# Patient Record
Sex: Male | Born: 1990 | Race: White | Hispanic: No | Marital: Single | Smoking: Former smoker
Health system: Southern US, Community
[De-identification: ages and names within clinical notes are randomized; demographics above are authoritative.]

## PROBLEM LIST (undated history)

## (undated) ENCOUNTER — Ambulatory Visit: Admission: EM | Payer: PRIVATE HEALTH INSURANCE

---
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
## 2010-03-12 ENCOUNTER — Emergency Department: Payer: Self-pay | Admitting: Emergency Medicine

## 2010-03-14 ENCOUNTER — Inpatient Hospital Stay (HOSPITAL_COMMUNITY): Admission: EM | Admit: 2010-03-14 | Discharge: 2010-03-18 | Payer: Self-pay | Admitting: Emergency Medicine

## 2010-03-18 DIAGNOSIS — F450 Somatization disorder: Secondary | ICD-10-CM

## 2010-03-30 ENCOUNTER — Ambulatory Visit (HOSPITAL_COMMUNITY): Payer: Self-pay | Admitting: Psychiatry

## 2010-04-27 ENCOUNTER — Ambulatory Visit (HOSPITAL_COMMUNITY): Payer: Self-pay | Admitting: Psychiatry

## 2010-06-02 ENCOUNTER — Ambulatory Visit (HOSPITAL_COMMUNITY)
Admission: RE | Admit: 2010-06-02 | Discharge: 2010-06-02 | Payer: Self-pay | Source: Home / Self Care | Attending: Psychology | Admitting: Psychology

## 2010-07-28 LAB — CBC
HCT: 43 % (ref 39.0–52.0)
MCHC: 33.7 g/dL (ref 30.0–36.0)
MCV: 94.1 fL (ref 78.0–100.0)
Platelets: 244 10*3/uL (ref 150–400)
RDW: 12.2 % (ref 11.5–15.5)
WBC: 7.5 10*3/uL (ref 4.0–10.5)

## 2010-07-28 LAB — TSH: TSH: 2.258 u[IU]/mL (ref 0.350–4.500)

## 2010-07-29 LAB — RAPID URINE DRUG SCREEN, HOSP PERFORMED
Amphetamines: NOT DETECTED
Barbiturates: NOT DETECTED
Benzodiazepines: NOT DETECTED
Cocaine: NOT DETECTED
Opiates: NOT DETECTED
Tetrahydrocannabinol: POSITIVE — AB

## 2010-07-29 LAB — DIFFERENTIAL
Basophils Absolute: 0.1 10*3/uL (ref 0.0–0.1)
Basophils Relative: 1 % (ref 0–1)
Eosinophils Absolute: 1.1 10*3/uL — ABNORMAL HIGH (ref 0.0–0.7)
Eosinophils Relative: 11 % — ABNORMAL HIGH (ref 0–5)
Lymphocytes Relative: 34 % (ref 12–46)
Lymphs Abs: 3.4 K/uL (ref 0.7–4.0)
Monocytes Absolute: 0.7 10*3/uL (ref 0.1–1.0)
Monocytes Relative: 7 % (ref 3–12)
Neutro Abs: 4.7 K/uL (ref 1.7–7.7)
Neutrophils Relative %: 47 % (ref 43–77)

## 2010-07-29 LAB — BASIC METABOLIC PANEL WITH GFR
Calcium: 9.5 mg/dL (ref 8.4–10.5)
GFR calc Af Amer: >60 mL/min (ref >60)
GFR calc non Af Amer: >60 mL/min (ref >60)
Sodium: 139 meq/L (ref 135–145)

## 2010-07-29 LAB — COMPREHENSIVE METABOLIC PANEL
ALT: 14 U/L (ref 0–53)
Albumin: 4.2 g/dL (ref 3.5–5.2)
Alkaline Phosphatase: 39 U/L (ref 39–117)
BUN: 14 mg/dL (ref 6–23)
CO2: 30 mEq/L (ref 19–32)
Calcium: 9.7 mg/dL (ref 8.4–10.5)
Chloride: 106 mEq/L (ref 96–112)
GFR calc non Af Amer: >60 mL/min (ref >60)
Glucose, Bld: 76 mg/dL (ref 70–99)
Glucose, Bld: 85 mg/dL (ref 70–99)
Potassium: 4.3 mEq/L (ref 3.5–5.1)
Sodium: 138 mEq/L (ref 135–145)
Sodium: 140 mEq/L (ref 135–145)
Total Bilirubin: 0.4 mg/dL (ref 0.3–1.2)
Total Protein: 6.9 g/dL (ref 6.0–8.3)

## 2010-07-29 LAB — URINALYSIS, ROUTINE W REFLEX MICROSCOPIC
Bilirubin Urine: NEGATIVE
Glucose, UA: NEGATIVE mg/dL
Hgb urine dipstick: NEGATIVE
Ketones, ur: NEGATIVE mg/dL
Nitrite: NEGATIVE
Protein, ur: NEGATIVE mg/dL
Specific Gravity, Urine: 1.018 (ref 1.005–1.030)
Urobilinogen, UA: 0.2 mg/dL (ref 0.0–1.0)
pH: 7 (ref 5.0–8.0)

## 2010-07-29 LAB — BASIC METABOLIC PANEL
BUN: 13 mg/dL (ref 6–23)
CO2: 22 mEq/L (ref 19–32)
Chloride: 109 mEq/L (ref 96–112)
Creatinine, Ser: 1.13 mg/dL (ref 0.4–1.5)
Glucose, Bld: 93 mg/dL (ref 70–99)
Potassium: 3.9 mEq/L (ref 3.5–5.1)

## 2010-07-29 LAB — TSH: TSH: 1.565 u[IU]/mL (ref 0.350–4.500)

## 2010-07-29 LAB — CBC
HCT: 43.4 % (ref 39.0–52.0)
HCT: 43.8 % (ref 39.0–52.0)
Hemoglobin: 15.3 g/dL (ref 13.0–17.0)
MCH: 32.2 pg (ref 26.0–34.0)
MCHC: 35.3 g/dL (ref 30.0–36.0)
MCV: 91.4 fL (ref 78.0–100.0)
MCV: 95 fL (ref 78.0–100.0)
Platelets: 289 10*3/uL (ref 150–400)
RBC: 4.61 MIL/uL (ref 4.22–5.81)
RBC: 4.75 MIL/uL (ref 4.22–5.81)
RDW: 12.1 % (ref 11.5–15.5)
WBC: 10 K/uL (ref 4.0–10.5)
WBC: 7.8 10*3/uL (ref 4.0–10.5)

## 2010-07-29 LAB — MRSA PCR SCREENING: MRSA by PCR: NEGATIVE

## 2010-07-29 LAB — ETHANOL: Alcohol, Ethyl (B): <5 mg/dL (ref 0–10)

## 2010-09-15 ENCOUNTER — Encounter (HOSPITAL_COMMUNITY): Payer: Self-pay | Admitting: Psychiatry

## 2010-10-15 ENCOUNTER — Encounter (HOSPITAL_COMMUNITY): Payer: 59 | Admitting: Psychiatry

## 2010-10-15 DIAGNOSIS — F909 Attention-deficit hyperactivity disorder, unspecified type: Secondary | ICD-10-CM

## 2010-11-12 ENCOUNTER — Encounter (HOSPITAL_COMMUNITY): Payer: 59 | Admitting: Psychiatry

## 2010-11-12 DIAGNOSIS — F909 Attention-deficit hyperactivity disorder, unspecified type: Secondary | ICD-10-CM

## 2010-12-15 ENCOUNTER — Encounter (HOSPITAL_COMMUNITY): Payer: 59 | Admitting: Psychiatry

## 2010-12-17 ENCOUNTER — Encounter (HOSPITAL_COMMUNITY): Payer: 59 | Admitting: Psychiatry

## 2012-08-23 ENCOUNTER — Ambulatory Visit: Payer: Self-pay | Admitting: Family Medicine

## 2012-08-23 ENCOUNTER — Emergency Department: Payer: Self-pay | Admitting: Emergency Medicine

## 2012-08-23 LAB — LIPASE, BLOOD: Lipase: 103 U/L (ref 73–393)

## 2012-08-23 LAB — COMPREHENSIVE METABOLIC PANEL
Albumin: 3.8 g/dL (ref 3.4–5.0)
Bilirubin,Total: 0.4 mg/dL (ref 0.2–1.0)
Chloride: 104 mmol/L (ref 98–107)
Co2: 27 mmol/L (ref 21–32)
Creatinine: 0.96 mg/dL (ref 0.60–1.30)
EGFR (African American): >60
Potassium: 4.7 mmol/L (ref 3.5–5.1)
SGOT(AST): 20 U/L (ref 15–37)
Sodium: 142 mmol/L (ref 136–145)
Total Protein: 7.3 g/dL (ref 6.4–8.2)

## 2012-08-23 LAB — CBC WITH DIFFERENTIAL/PLATELET
HCT: 45.1 % (ref 40.0–52.0)
Lymphocyte %: 24 %
MCHC: 33.6 g/dL (ref 32.0–36.0)
MCV: 92 fL (ref 80–100)
Monocyte %: 9.2 %
Neutrophil #: 4.6 10*3/uL (ref 1.4–6.5)
Neutrophil %: 58.3 %
Platelet: 294 10*3/uL (ref 150–440)

## 2012-08-23 LAB — URINALYSIS, COMPLETE
Blood: NEGATIVE
Glucose,UR: NEGATIVE mg/dL (ref 0–75)
Leukocyte Esterase: NEGATIVE
Nitrite: NEGATIVE
Ph: 7 (ref 4.5–8.0)

## 2012-08-23 LAB — MONONUCLEOSIS SCREEN: Mono Test: POSITIVE

## 2012-08-23 LAB — AMYLASE: Amylase: 43 U/L (ref 25–115)

## 2013-07-22 LAB — CBC
HCT: 41.4 % (ref 40.0–52.0)
HGB: 13.8 g/dL (ref 13.0–18.0)
MCH: 30.6 pg (ref 26.0–34.0)
MCHC: 33.4 g/dL (ref 32.0–36.0)
MCV: 92 fL (ref 80–100)
PLATELETS: 269 10*3/uL (ref 150–440)
RBC: 4.51 10*6/uL (ref 4.40–5.90)
RDW: 13.3 % (ref 11.5–14.5)
WBC: 10.2 10*3/uL (ref 3.8–10.6)

## 2013-07-22 LAB — COMPREHENSIVE METABOLIC PANEL
ALBUMIN: 3.7 g/dL (ref 3.4–5.0)
Alkaline Phosphatase: 55 U/L
Anion Gap: 7 (ref 7–16)
BUN: 18 mg/dL (ref 7–18)
Bilirubin,Total: 0.2 mg/dL (ref 0.2–1.0)
CO2: 24 mmol/L (ref 21–32)
Calcium, Total: 8.8 mg/dL (ref 8.5–10.1)
Chloride: 111 mmol/L — ABNORMAL HIGH (ref 98–107)
Creatinine: 0.99 mg/dL (ref 0.60–1.30)
GLUCOSE: 96 mg/dL (ref 65–99)
OSMOLALITY: 285 (ref 275–301)
POTASSIUM: 3.8 mmol/L (ref 3.5–5.1)
SGOT(AST): 13 U/L — ABNORMAL LOW (ref 15–37)
SGPT (ALT): 20 U/L (ref 12–78)
Sodium: 142 mmol/L (ref 136–145)
TOTAL PROTEIN: 7.2 g/dL (ref 6.4–8.2)

## 2013-07-22 LAB — ETHANOL

## 2013-07-22 LAB — ACETAMINOPHEN LEVEL: Acetaminophen: <2 ug/mL

## 2013-07-22 LAB — SALICYLATE LEVEL: Salicylates, Serum: <1.7 mg/dL

## 2013-07-23 ENCOUNTER — Inpatient Hospital Stay: Payer: Self-pay | Admitting: Psychiatry

## 2013-07-23 LAB — DRUG SCREEN, URINE
Amphetamines, Ur Screen: NEGATIVE (ref ?–1000)
BARBITURATES, UR SCREEN: NEGATIVE (ref ?–200)
BENZODIAZEPINE, UR SCRN: NEGATIVE (ref ?–200)
Cannabinoid 50 Ng, Ur ~~LOC~~: POSITIVE (ref ?–50)
Cocaine Metabolite,Ur ~~LOC~~: NEGATIVE (ref ?–300)
MDMA (ECSTASY) UR SCREEN: NEGATIVE (ref ?–500)
METHADONE, UR SCREEN: NEGATIVE (ref ?–300)
Opiate, Ur Screen: NEGATIVE (ref ?–300)
PHENCYCLIDINE (PCP) UR S: NEGATIVE (ref ?–25)
Tricyclic, Ur Screen: NEGATIVE (ref ?–1000)

## 2013-07-23 LAB — URINALYSIS, COMPLETE
Bacteria: NONE SEEN
Bilirubin,UR: NEGATIVE
Blood: NEGATIVE
Glucose,UR: NEGATIVE mg/dL (ref 0–75)
KETONE: NEGATIVE
LEUKOCYTE ESTERASE: NEGATIVE
Nitrite: NEGATIVE
PROTEIN: NEGATIVE
Ph: 5 (ref 4.5–8.0)
Specific Gravity: 1.031 (ref 1.003–1.030)
Squamous Epithelial: NONE SEEN
WBC UR: 1 /HPF (ref 0–5)

## 2013-07-23 LAB — TSH: Thyroid Stimulating Horm: 0.722 u[IU]/mL

## 2014-09-07 NOTE — H&P (Signed)
PATIENT NAME:  LEUL, NARRAMORE MR#:  [id number] DATE OF BIRTH:  03/31/1991  DATE OF ADMISSION:  07/23/2013  REFERRING PHYSICIAN: Emergency Room MD   ATTENDING PHYSICIAN: Rosabell Geyer B. Jennet Maduro, MD   IDENTIFYING DATA: Mr. Chagnon is a 24 year old male with history of ADHD.  CHIEF COMPLAINT: "I tried to hang myself."   HISTORY OF PRESENT ILLNESS: Mr. Tursi was brought to the hospital by his family after he attempted suicide by initially superficially scratching his wrist and then attempting to hang himself in the closet with a belt. He has been arguing with his wife more so than ever. They have a difficult relationship and a 77-month-old baby. Even though the wife and the patient both work there is not enough money coming in every month. The patient's mother has been helping them, but apparently his wife does not like her mother-in-law to intervene. The patient reports that as a child he was treated with multiple medications for ADHD but he stopped taking them at the age of 41. He is now 22. He has been under increased stress test lately due to conflict with his wife and the hardships of taking care of the baby. He reports poor sleep, decreased appetite with 6 or 7 pound weight loss, anhedonia, feeling of guilt, hopelessness, worthlessness, poor memory and concentration, poor energy level, and crying spells, but also irritability, inability to keep his cool, and argumentativeness. He denies having suicide ideation for awhile, but he was so exasperated arguing with his wife that felt that this is the only way to go. Today he is somewhat happy that things did not work out, but is uncertain if he could be safe at home now. He is agreeable to a family meeting. He denies symptoms suggestive of bipolar mania. He denies psychotic symptoms. He is not a drinker, but does use marijuana on a regular basis.   PAST PSYCHIATRIC HISTORY: As above ADHD as a child. There were no other suicide attempts. No  hospitalizations. He is ready to start medications now.  FAMILY PSYCHIATRIC HISTORY: Father with mental illness.   PAST MEDICAL HISTORY: None.   ALLERGIES: No known drug allergies.   MEDICATIONS ON ADMISSION: None.   SOCIAL HISTORY: He is married. He lives with his wife and his brother and a 54-month-old baby. They all work. The patient actually works 2 jobs to support his family. He feels that his wife spends too much money and is wasteful.   REVIEW OF SYSTEMS: CONSTITUTIONAL: No fevers or chills. No weight changes.  EYES: No double or blurred vision.  ENT: No hearing loss.  RESPIRATORY: No shortness of breath or cough.  CARDIOVASCULAR: No chest pain or orthopnea.  GASTROINTESTINAL: No abdominal pain, nausea, vomiting, or diarrhea.  GENITOURINARY: No incontinence or frequency.  ENDOCRINE: No heat or cold intolerance.  LYMPHATIC: No anemia or easy bruising.  INTEGUMENTARY: No acne or rash.  MUSCULOSKELETAL: No muscle or joint pain.  NEUROLOGIC: No tingling or weakness.  PSYCHIATRIC: See history of present illness for details.   PHYSICAL EXAMINATION: VITAL SIGNS: Blood pressure 130/84, pulse 78, respirations 20, temperature 97.9.  GENERAL: This is a tall, slightly obese male in no acute distress.  HEENT: The pupils are equal, round, and reactive to light. Sclerae are anicteric.  NECK: Supple. No thyromegaly.  LUNGS: Clear to auscultation. No dullness to percussion.  HEART: Regular rhythm and rate. No murmurs, rubs, or gallops.  ABDOMEN: Soft, nontender, nondistended. Positive bowel sounds.  MUSCULOSKELETAL: Normal muscle strength in all extremities.  SKIN:  No rashes or bruises.  LYMPHATIC: No cervical adenopathy.  NEUROLOGIC: Cranial nerves II through XII are intact.   LABORATORY DATA: Chemistries are within normal limits. Blood alcohol level is zero. LFTs within normal limits. TSH 0.722. Urine tox screen is positive for cannabis. CBC within normal limits. Urinalysis is not  suggestive of urinary tract infection. Serum acetaminophen and salicylates are low.   MENTAL STATUS EXAMINATION ON ADMISSION: The patient is alert and oriented to person, place, time, and situation. He is pleasant, polite, and cooperative. There is some psychomotor retardation. He maintains some eye contact. Speech is slow. Mood is depressed with flat affect. Thought process is logical and goal oriented. Thought content: He denies suicidal or homicidal ideation in the hospital, but fears that if discharged home he would go ahead and try to kill himself again. There are no delusions or paranoia. There are no auditory or visual hallucinations. His cognition is grossly intact. He registers three out of three and recalls three out of three objects after 5 minutes. He can spell "world" forwards and backwards. He knows the current president. He is of normal intelligence and appropriate fund of knowledge. His insight and judgment are limited.   SUICIDE RISK ASSESSMENT ON ADMISSION: This is a patient with history of attention deficit/hyperactivity disorder who developed depressive symptoms and suicidal ideation in the context of marital problems. He attempted suicide by cutting and hanging. He is at increased risk of suicide.   DIAGNOSES: AXIS I:  1.  Major depressive episode, severe. 2.  Cannibis abuse. 3.  Attention deficit/hyperactivity disorder by history.  AXIS II: Deferred.  AXIS III: Deferred.  AXIS IV: Mental illness, marital conflict, financial.  AXIS V: Global assessment of functioning 25.   PLAN: The patient was admitted to [facility] unit for safety, stabilization and medication management. He was initially placed on suicide precautions and was closely monitored for any unsafe behavior. He underwent full psychiatric and risk assessment. He received pharmacotherapy, individual and group psychotherapy, substance abuse counseling, and support from  therapeutic milieu.  1.  Suicidal ideation: The patient is able to contract for safety.  2.  Mood: The patient was started on Prozac for depression and Tegretol for mood stabilization.  3.  Substance abuse: The patient minimizes his problems and declines residential treatment.  4.  Disposition: He will return to home.  ____________________________ Ellin GoodieJolanta B. Jennet MaduroPucilowska, MD jbp:sb D: 07/23/2013 17:00:49 ET T: 07/23/2013 17:19:47 ET JOB#: [id number]  cc: Shamell Suarez B. Jennet MaduroPucilowska, MD, <Dictator> Shari ProwsJOLANTA B Tyke Outman MD ELECTRONICALLY SIGNED 07/28/2013 15:17

## 2014-09-10 IMAGING — CR CERVICAL SPINE - COMPLETE 4+ VIEW
1 series · 6 of 6 positions shown · non-contrast
Comparison: None available for comparison at time of study
interpretation.

CLINICAL DATA: Hanging injury.

EXAM:
CERVICAL SPINE  4+ VIEWS

[Series 1: w cervical spine obl · 0.14mm/px · 6 of 6 slices shown]
[im 1/6]
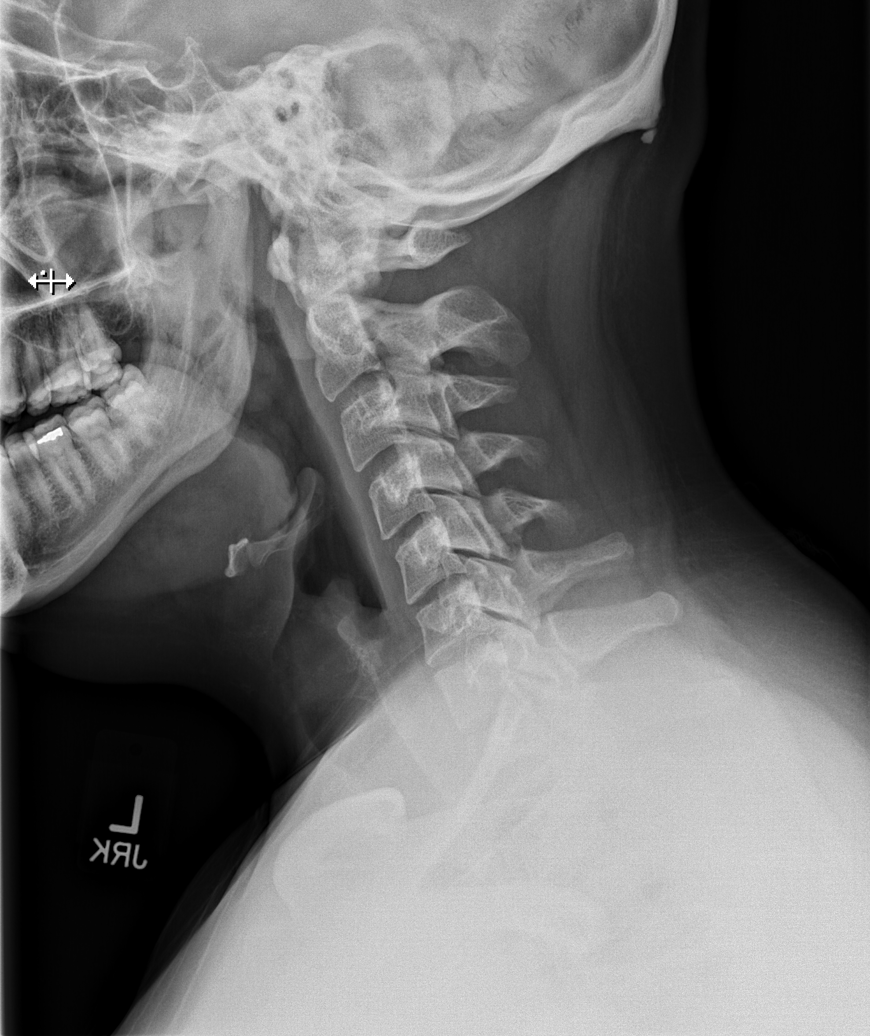
[im 2/6]
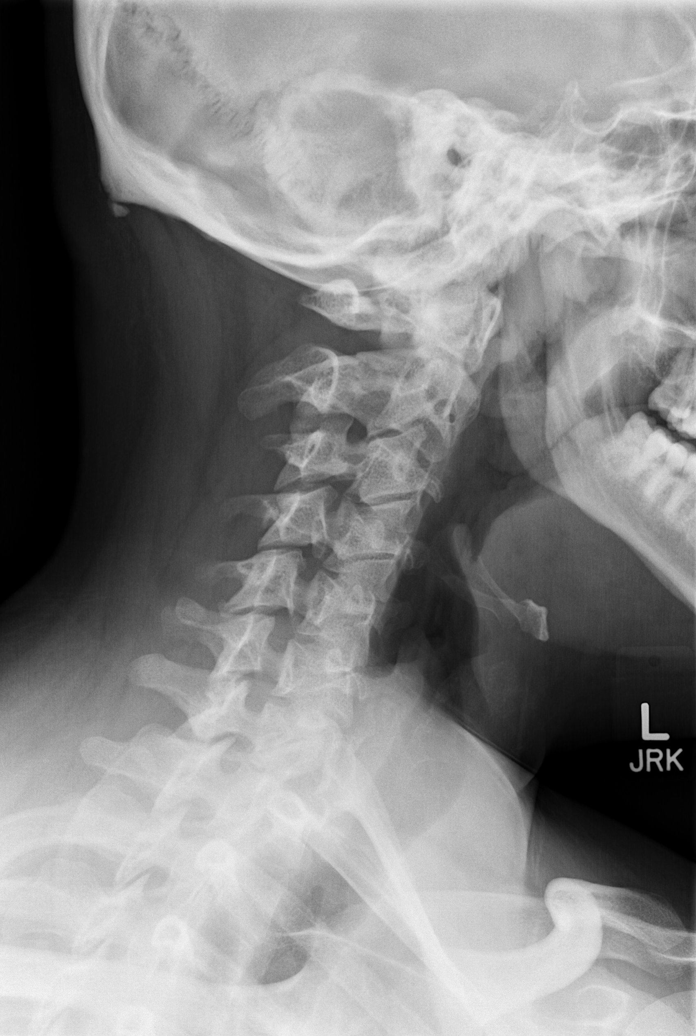
[im 3/6]
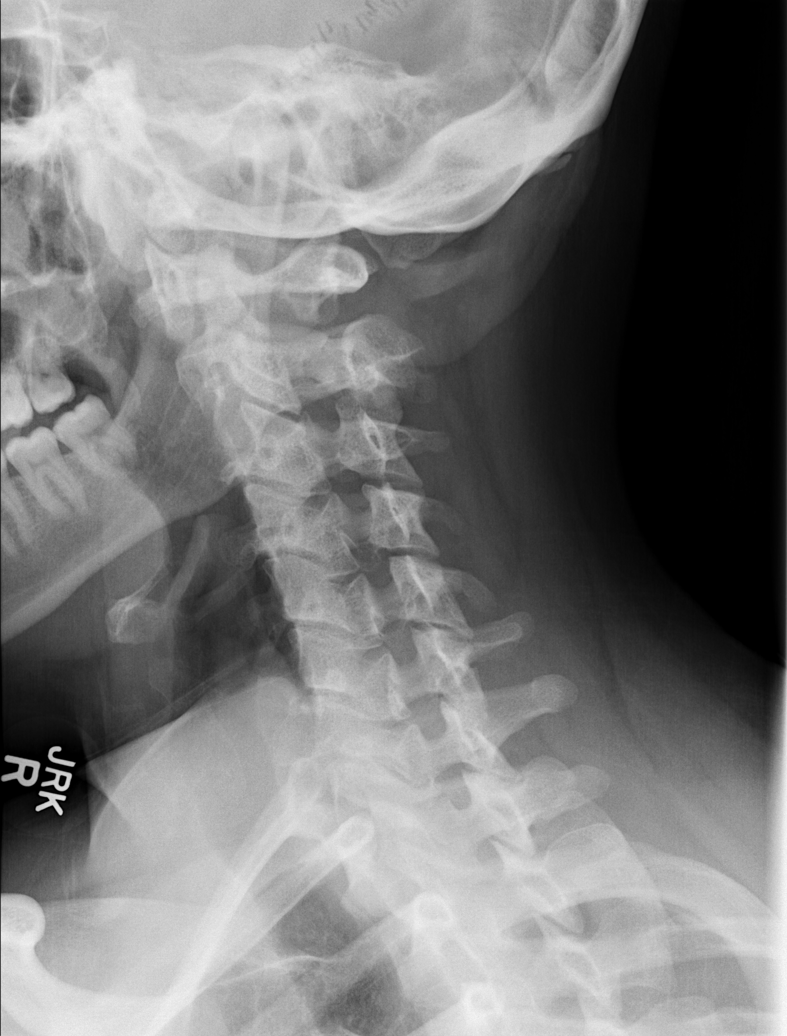
[im 4/6]
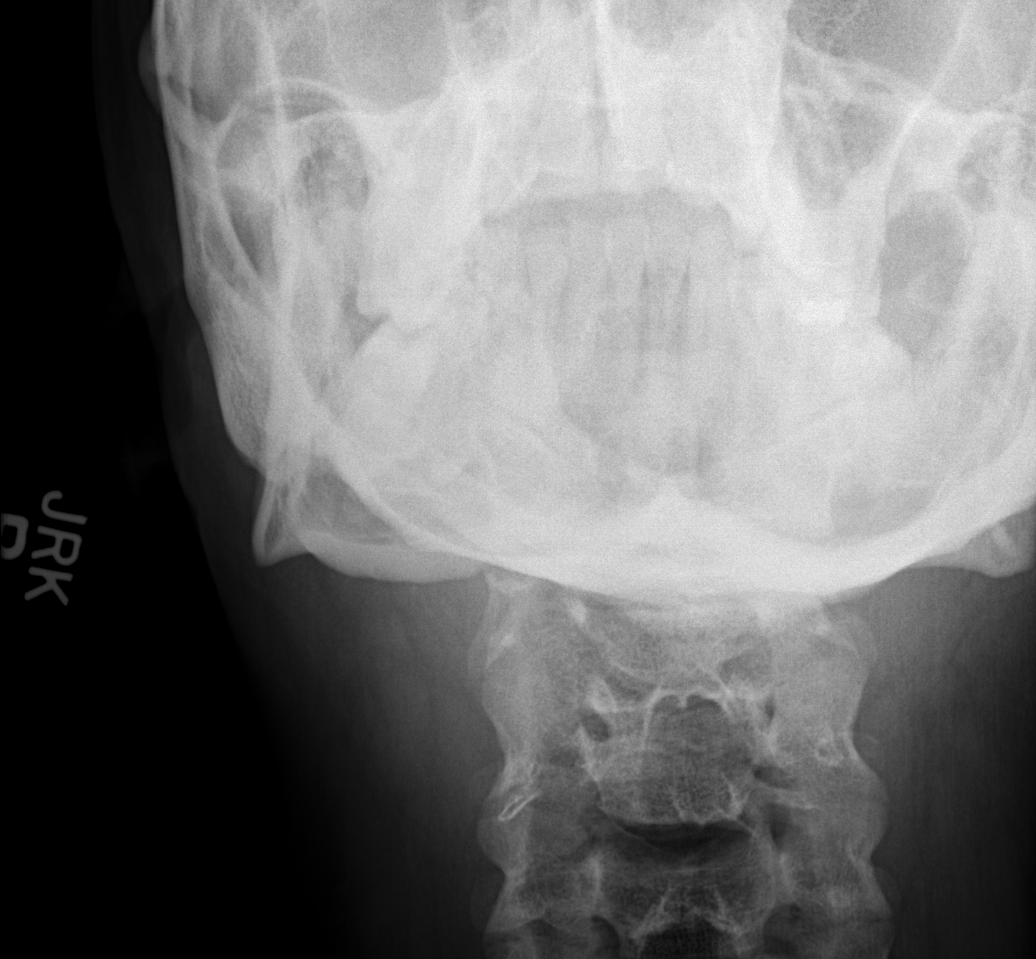
[im 5/6]
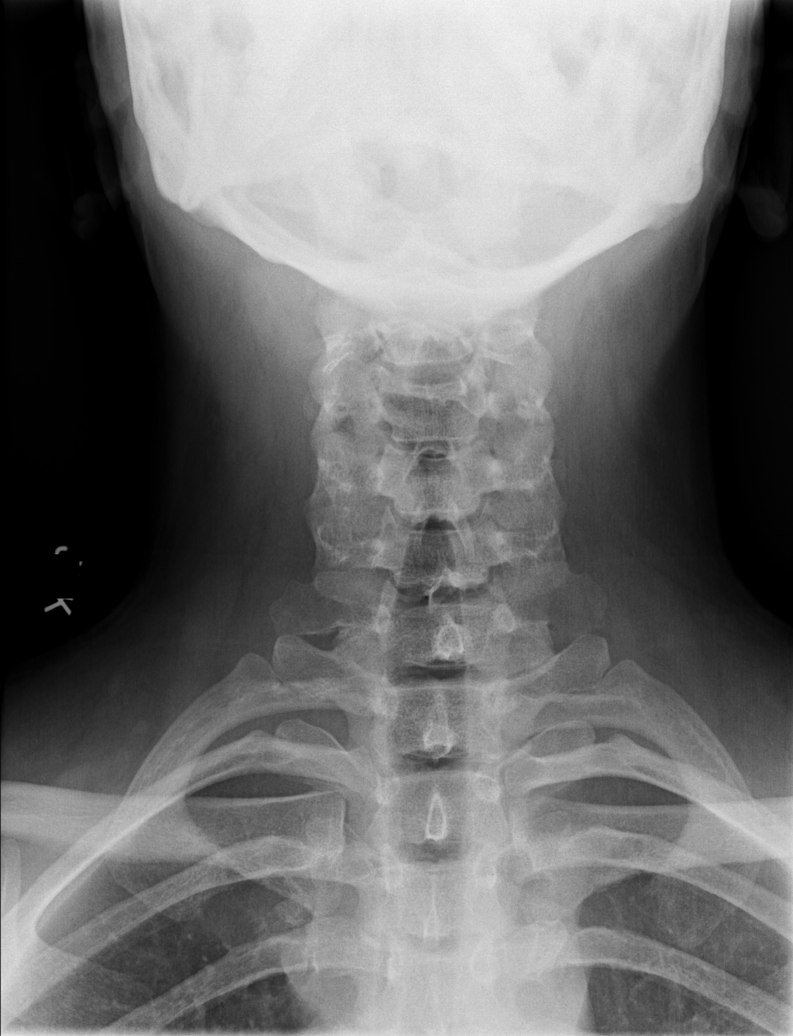
[im 6/6]
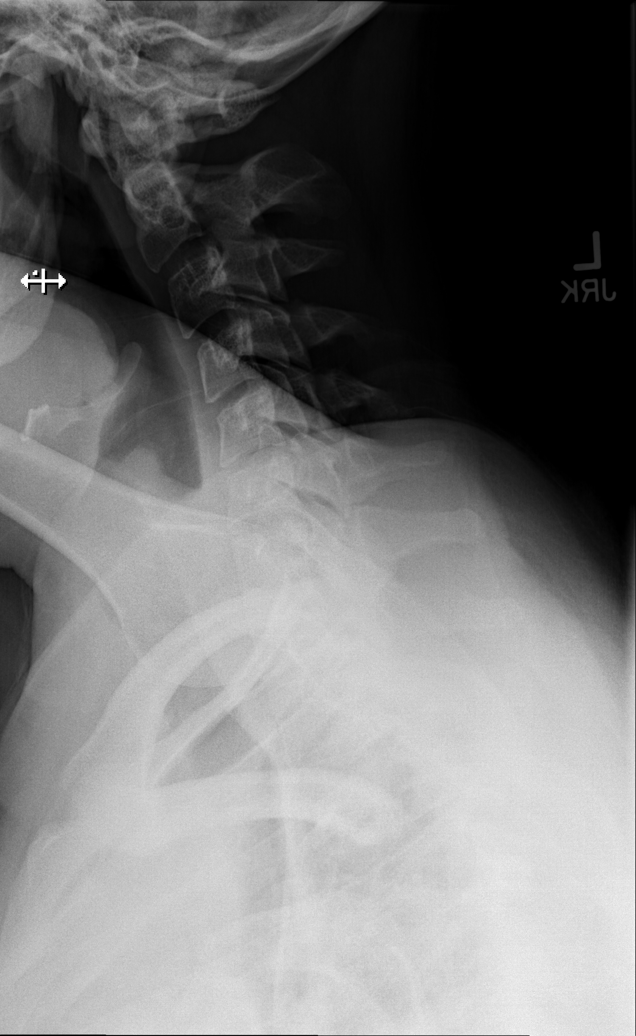

[6 of 6 positions shown; findings below may reference images not displayed]

FINDINGS: Cervical vertebral bodies and posterior elements appear intact and
aligned to the inferior endplate of C7, the most caudal well
visualized level. Straightened cervical lordosis. Intervertebral
disc heights preserved. No destructive bony lesions. Lateral masses
grossly and alignment though, limited odontoid view. Prevertebral
and paraspinal soft tissue planes are nonsuspicious.
IMPRESSION: Straightened cervical lordosis without acute fracture deformity or
malalignment.

  By: Carissa Salameh

## 2015-12-08 NOTE — Progress Notes (Signed)
This encounter was created in error - please disregard.

## 2016-07-20 DIAGNOSIS — Z720 Tobacco use: Secondary | ICD-10-CM | POA: Insufficient documentation

## 2016-07-20 DIAGNOSIS — Z6836 Body mass index (BMI) 36.0-36.9, adult: Secondary | ICD-10-CM | POA: Insufficient documentation

## 2016-07-20 DIAGNOSIS — K219 Gastro-esophageal reflux disease without esophagitis: Secondary | ICD-10-CM | POA: Insufficient documentation

## 2016-12-24 ENCOUNTER — Ambulatory Visit
Admission: EM | Admit: 2016-12-24 | Discharge: 2016-12-24 | Disposition: A | Discharge status: Home / Self Care | Payer: BLUE CROSS/BLUE SHIELD | Attending: Family | Admitting: Family

## 2016-12-24 ENCOUNTER — Encounter: Payer: Self-pay | Admitting: *Deleted

## 2016-12-24 DIAGNOSIS — K529 Noninfective gastroenteritis and colitis, unspecified: Secondary | ICD-10-CM | POA: Diagnosis not present

## 2016-12-24 DIAGNOSIS — R112 Nausea with vomiting, unspecified: Secondary | ICD-10-CM

## 2016-12-24 MED ORDER — ONDANSETRON HCL 4 MG PO TABS: 4.0000 mg | ORAL_TABLET | Freq: Three times a day (TID) | ORAL | 0 refills | Status: DC | PRN

## 2016-12-24 MED ORDER — ONDANSETRON 8 MG PO TBDP
8.0000 mg | ORAL_TABLET | Freq: Once | ORAL | Status: AC
  Administered 2016-12-24: 8 mg via ORAL

## 2016-12-24 NOTE — ED Provider Notes (Signed)
[facility]    CSN: [id number] Arrival date & time: 12/24/16  1436     History   Chief Complaint Chief Complaint  Patient presents with  . Nausea  . Emesis  . Generalized Body Aches  . Headache    HPI Brandon Molina is a 26 y.o. male.   Chief complaint nausea, vomiting started early this morning, unchanged.   3-4 episodes emesis, undigested food. No blood.  Ate last yesterday chinese food which isn't abnormal for him, and then tried chicken noodle soup and threw it up. No suspicious foods. Drinking gatorade.   No diarrhea, epigastric burning, CP, SOB, abdoiminal pain, bloating, constipation, dysuria, dizziness.   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   GERD- takes zantac PRN.       PCP [facility]. History of smoking, elevated blood pressure, GERD History reviewed. No pertinent past medical history.  There are no active problems to display for this patient.   History reviewed. No pertinent surgical history.     Home Medications    Prior to Admission medications   Medication Sig Start Date End Date Taking? Authorizing Provider  cetirizine (ZYRTEC) 10 MG tablet Take 10 mg by mouth daily.   Yes [provider]  ondansetron (ZOFRAN) 4 MG tablet Take 1 tablet (4 mg total) by mouth every 8 (eight) hours as needed for nausea or vomiting. 12/24/16   Allegra GranaArnett, Wayne Brunker G, FNP    Family History History reviewed. No pertinent family history.  Social History Social History  Substance Use Topics  . Smoking status: Current Every Day Smoker  . Smokeless tobacco: Never Used  . Alcohol use Yes     Allergies   Patient has no known allergies.   Review of Systems Review of Systems  Constitutional: Negative for chills and fever.  Respiratory: Negative for cough.   Cardiovascular: Negative for chest pain  and palpitations.  Gastrointestinal: Positive for nausea and vomiting. Negative for abdominal distention, abdominal pain, anal bleeding, blood in stool, constipation and diarrhea.  Genitourinary: Negative for dysuria.  Neurological: Negative for dizziness and headaches.     Physical Exam Triage Vital Signs ED Triage Vitals  Enc Vitals Group     BP 12/24/16 1526 134/88     Pulse Rate 12/24/16 1526 68     Resp 12/24/16 1526 16     Temp 12/24/16 1526 98.1 F (36.7 C)     Temp Source 12/24/16 1526 Oral     SpO2 12/24/16 1526 99 %     Weight 12/24/16 1529 280 lb (127 kg)     Height 12/24/16 1529 6\' 4"  (1.93 m)     Head Circumference --      Peak Flow --      Pain Score --      Pain Loc --      Pain Edu? --      Excl. in GC? --    No data found.   Updated Vital Signs BP 134/88 (BP Location: Left Arm)   Pulse 68   Temp 98.1 F (36.7 C) (Oral)   Resp 16   Ht 6\' 4"  (1.93 m)   Wt 280 lb (127 kg)   SpO2 99%   BMI 34.08 kg/m   Visual Acuity Right Eye Distance:   Left Eye Distance:   Bilateral Distance:    Right  Eye Near:   Left Eye Near:    Bilateral Near:     Physical Exam  Constitutional: He appears well-developed and well-nourished.  Cardiovascular: Regular rhythm and normal heart sounds.   Pulmonary/Chest: Effort normal and breath sounds normal. No respiratory distress. He has no wheezes. He has no rhonchi. He has no rales.  Abdominal: Normal appearance and bowel sounds are normal. He exhibits no pulsatile midline mass and no mass. There is no tenderness. There is no rigidity, no guarding, no CVA tenderness, no tenderness at McBurney's point and negative Murphy's sign.  No suprapubic tenderness.   Neurological: He is alert.  Skin: Skin is warm and dry.  Psychiatric: He has a normal mood and affect. His speech is normal and behavior is normal.  Vitals reviewed.    UC Treatments / Results  Labs (all labs ordered are listed, but only abnormal results are  displayed) Labs Reviewed - No data to display  EKG  EKG Interpretation None       Radiology No results found.  Procedures Procedures (including critical care time)  Medications Ordered in UC Medications  ondansetron (ZOFRAN-ODT) disintegrating tablet 8 mg (8 mg Oral Given 12/24/16 1527)     Initial Impression / Assessment and Plan / UC Course  I have reviewed the triage vital signs and the nursing notes.  Pertinent labs & imaging results that were available during my care of the patient were reviewed by me and considered in my medical decision making (see chart for details).       Final Clinical Impressions(s) / UC Diagnoses   Final diagnoses:  Gastroenteritis  Patient is well-appearing. He is afebrile. Some improvement with nausea, vomiting after taking Zofran while in her clinic. Last episode of emesis was this morning, and it was nonbloody. Reassured by normal abdominal exam, the absence of any tenderness, or rebound. Discussed with patient and mother, most likely viral gastroenteritis. Discussed conservative treatment of this. A few tabs of stat Zofran for symptom relief. Return precautions given.   New Prescriptions New Prescriptions   ONDANSETRON (ZOFRAN) 4 MG TABLET    Take 1 tablet (4 mg total) by mouth every 8 (eight) hours as needed for nausea or vomiting.     Controlled Substance Prescriptions [place] Controlled Substance Registry consulted? Not Applicable   Allegra Grana, FNP 12/24/16 [phone]

## 2016-12-24 NOTE — Discharge Instructions (Signed)
Suspect bowel gastroenteritis as discussed Please stay vigilant for any worsening symptoms including blood in your vomit or diarrhea, abdominal pain, fever Ensure plenty of fluids, advanced diet as tolerated starting with clear liquids Work note provided

## 2016-12-24 NOTE — ED Triage Notes (Signed)
N/V, body aches, headaches, fatigue, possible fever, since last night.

## 2018-12-19 ENCOUNTER — Ambulatory Visit (LOCAL_COMMUNITY_HEALTH_CENTER): Payer: Self-pay

## 2018-12-19 ENCOUNTER — Other Ambulatory Visit: Payer: Self-pay

## 2018-12-19 DIAGNOSIS — Z719 Counseling, unspecified: Secondary | ICD-10-CM

## 2018-12-19 NOTE — Progress Notes (Signed)
Pt to clinic requesting Hep A vaccine. Pt counseled that he has already completed his 2 dose series. [facility] copy provided to pt.

## 2018-12-20 ENCOUNTER — Other Ambulatory Visit: Payer: Self-pay

## 2018-12-20 ENCOUNTER — Encounter (HOSPITAL_COMMUNITY): Payer: Self-pay

## 2018-12-20 ENCOUNTER — Ambulatory Visit (HOSPITAL_COMMUNITY)
Admission: EM | Admit: 2018-12-20 | Discharge: 2018-12-20 | Disposition: A | Discharge status: Home / Self Care | Payer: PRIVATE HEALTH INSURANCE | Attending: Emergency Medicine | Admitting: Emergency Medicine

## 2018-12-20 DIAGNOSIS — R112 Nausea with vomiting, unspecified: Secondary | ICD-10-CM | POA: Diagnosis not present

## 2018-12-20 DIAGNOSIS — R030 Elevated blood-pressure reading, without diagnosis of hypertension: Secondary | ICD-10-CM | POA: Diagnosis not present

## 2018-12-20 DIAGNOSIS — R197 Diarrhea, unspecified: Secondary | ICD-10-CM

## 2018-12-20 MED ORDER — ONDANSETRON HCL 4 MG PO TABS: 4.0000 mg | ORAL_TABLET | Freq: Three times a day (TID) | ORAL | 0 refills | Status: DC | PRN

## 2018-12-20 MED ORDER — OMEPRAZOLE 20 MG PO CPDR: 20.0000 mg | DELAYED_RELEASE_CAPSULE | Freq: Every day | ORAL | 0 refills | Status: DC

## 2018-12-20 NOTE — ED Triage Notes (Signed)
Pt states he has diarrhea and nauseous x 3 days. Pt states his acid reflux has been flaring up.

## 2018-12-20 NOTE — ED Provider Notes (Signed)
[facility]    CSN: [id number] Arrival date & time: 12/20/18  [id number]     History   Chief Complaint Chief Complaint  Patient presents with  . Diarrhea  . Nausea    HPI Brandon Molina is a 28 y.o. male.   Patient presents with nausea, vomiting, diarrhea, and food regurgitation x3 days.  He states his symptoms began after eating a store-bought stomboli.  No emesis today; one episode of diarrhea.  He has taken Pepto-Bismol and Gaviscon with minimal relief.  He reports he is able to orally hydrate at home and has not vomited when he drinks fluids.  He denies fevers, chills, nasal congestion, sore throat, cough, shortness of breath, abdominal pain, dysuria, flank pain, or other symptoms.        The history is provided by the patient.    History reviewed. No pertinent past medical history.  There are no active problems to display for this patient.   History reviewed. No pertinent surgical history.     Home Medications    Prior to Admission medications   Medication Sig Start Date End Date Taking? Authorizing Provider  cetirizine (ZYRTEC) 10 MG tablet Take 10 mg by mouth daily.    [provider]  omeprazole (PRILOSEC) 20 MG capsule Take 1 capsule (20 mg total) by mouth daily. 12/20/18   Mickie Bailate, Raesean Bartoletti H, NP  ondansetron (ZOFRAN) 4 MG tablet Take 1 tablet (4 mg total) by mouth every 8 (eight) hours as needed for nausea or vomiting. 12/20/18   Mickie Bailate, Dominic Rhome H, NP    Family History History reviewed. No pertinent family history.  Social History Social History   Tobacco Use  . Smoking status: Current Every Day Smoker  . Smokeless tobacco: Never Used  Substance Use Topics  . Alcohol use: Yes  . Drug use: No     Allergies   Patient has no known allergies.   Review of Systems Review of Systems  Constitutional: Negative for chills and fever.  HENT: Negative for congestion, ear pain, rhinorrhea and sore throat.   Eyes: Negative for pain and visual  disturbance.  Respiratory: Negative for cough and shortness of breath.   Cardiovascular: Negative for chest pain and palpitations.  Gastrointestinal: Positive for diarrhea, nausea and vomiting. Negative for abdominal pain.  Genitourinary: Negative for dysuria, flank pain and hematuria.  Musculoskeletal: Negative for arthralgias and back pain.  Skin: Negative for color change and rash.  Neurological: Negative for seizures and syncope.  All other systems reviewed and are negative.    Physical Exam Triage Vital Signs ED Triage Vitals  Enc Vitals Group     BP      Pulse      Resp      Temp      Temp src      SpO2      Weight      Height      Head Circumference      Peak Flow      Pain Score      Pain Loc      Pain Edu?      Excl. in GC?    No data found.  Updated Vital Signs BP (!) 147/93 (BP Location: Right Arm)   Pulse 76   Temp 98.5 F (36.9 C) (Oral)   Resp 18   Wt 280 lb (127 kg)   SpO2 100%   BMI 34.08 kg/m   Visual Acuity Right Eye Distance:   Left Eye  Distance:   Bilateral Distance:    Right Eye Near:   Left Eye Near:    Bilateral Near:     Physical Exam Vitals signs and nursing note reviewed.  Constitutional:      Appearance: He is well-developed.  HENT:     Head: Normocephalic and atraumatic.     Right Ear: Tympanic membrane normal.     Left Ear: Tympanic membrane normal.     Nose: Nose normal.     Mouth/Throat:     Mouth: Mucous membranes are moist.     Pharynx: Oropharynx is clear.  Eyes:     Conjunctiva/sclera: Conjunctivae normal.  Neck:     Musculoskeletal: Neck supple.  Cardiovascular:     Rate and Rhythm: Normal rate and regular rhythm.     Heart sounds: No murmur.  Pulmonary:     Effort: Pulmonary effort is normal. No respiratory distress.     Breath sounds: Normal breath sounds.  Abdominal:     Palpations: Abdomen is soft.     Tenderness: There is no abdominal tenderness. There is no right CVA tenderness, left CVA tenderness,  guarding or rebound.  Skin:    General: Skin is warm and dry.     Findings: No rash.  Neurological:     Mental Status: He is alert.      UC Treatments / Results  Labs (all labs ordered are listed, but only abnormal results are displayed) Labs Reviewed - No data to display  EKG   Radiology No results found.  Procedures Procedures (including critical care time)  Medications Ordered in UC Medications - No data to display  Initial Impression / Assessment and Plan / UC Course  I have reviewed the triage vital signs and the nursing notes.  Pertinent labs & imaging results that were available during my care of the patient were reviewed by me and considered in my medical decision making (see chart for details).   Nausea, vomiting, diarrhea.  Elevated blood pressure without hypertension.  Treating with Zofran and omeprazole.  Patient has been able to maintain his oral hydration at home and he is well-appearing at this time.  Discussed with patient that he should return here if he is unable to maintain his hydration or if his symptoms worsen.  Discussed with patient that his blood pressure is slightly elevated today and that he should have this rechecked in a couple of weeks.  Discussed that he needs to establish a primary care provider to follow his blood pressure.     Final Clinical Impressions(s) / UC Diagnoses   Final diagnoses:  Nausea vomiting and diarrhea  Elevated blood-pressure reading without diagnosis of hypertension     Discharge Instructions     Take the Zofran as needed for your nausea and vomiting.    Take the omeprazole daily for 14 days.    Continue to maintain your oral hydration by drinking 8 to 12 glasses of water per day.  Return here if you are unable to maintain your hydration or if your symptoms worsen.    Your blood pressure was elevated today at 147/93.  You should have this rechecked in the next couple of weeks.        ED Prescriptions     Medication Sig Dispense Auth. Provider   omeprazole (PRILOSEC) 20 MG capsule Take 1 capsule (20 mg total) by mouth daily. 14 capsule Wendee Beaversate, Danyle Boening H, NP   ondansetron (ZOFRAN) 4 MG tablet Take 1 tablet (4 mg total) by  mouth every 8 (eight) hours as needed for nausea or vomiting. 12 tablet Sharion Balloon, NP     Controlled Substance Prescriptions [place] Controlled Substance Registry consulted? Not Applicable   Sharion Balloon, NP 12/20/18 1031

## 2018-12-20 NOTE — Discharge Instructions (Signed)
Take the Zofran as needed for your nausea and vomiting.    Take the omeprazole daily for 14 days.    Continue to maintain your oral hydration by drinking 8 to 12 glasses of water per day.  Return here if you are unable to maintain your hydration or if your symptoms worsen.    Your blood pressure was elevated today at 147/93.  You should have this rechecked in the next couple of weeks.

## 2020-04-20 ENCOUNTER — Emergency Department
Admission: EM | Admit: 2020-04-20 | Discharge: 2020-04-20 | Disposition: A | Discharge status: Home / Self Care | Payer: PRIVATE HEALTH INSURANCE | Attending: Emergency Medicine | Admitting: Emergency Medicine

## 2020-04-20 ENCOUNTER — Encounter: Payer: Self-pay | Admitting: Emergency Medicine

## 2020-04-20 ENCOUNTER — Other Ambulatory Visit: Payer: Self-pay

## 2020-04-20 DIAGNOSIS — J0100 Acute maxillary sinusitis, unspecified: Secondary | ICD-10-CM | POA: Diagnosis not present

## 2020-04-20 DIAGNOSIS — R519 Headache, unspecified: Secondary | ICD-10-CM | POA: Diagnosis present

## 2020-04-20 DIAGNOSIS — Z87891 Personal history of nicotine dependence: Secondary | ICD-10-CM | POA: Diagnosis not present

## 2020-04-20 DIAGNOSIS — Z20822 Contact with and (suspected) exposure to covid-19: Secondary | ICD-10-CM | POA: Insufficient documentation

## 2020-04-20 LAB — RESP PANEL BY RT-PCR (FLU A&B, COVID) ARPGX2
Influenza A by PCR: NEGATIVE
Influenza B by PCR: NEGATIVE
SARS Coronavirus 2 by RT PCR: NEGATIVE

## 2020-04-20 MED ORDER — TRAMADOL HCL 50 MG PO TABS
50.0000 mg | ORAL_TABLET | Freq: Four times a day (QID) | ORAL | 0 refills | Status: DC | PRN
Start: 2020-04-20 — End: 2024-04-01

## 2020-04-20 MED ORDER — AMOXICILLIN 875 MG PO TABS: 875.0000 mg | ORAL_TABLET | Freq: Two times a day (BID) | ORAL | 0 refills | Status: DC

## 2020-04-20 MED ORDER — PREDNISONE 10 MG PO TABS: 30.0000 mg | ORAL_TABLET | Freq: Every day | ORAL | 0 refills | Status: DC

## 2020-04-20 NOTE — ED Provider Notes (Signed)
[facility] Emergency Department Provider Note  ____________________________________________   First MD Initiated Contact with Patient 04/20/20 1210     (approximate)  I have reviewed the triage vital signs and the nursing notes.   HISTORY  Chief Complaint Facial Pain    HPI Brandon Molina is a 29 y.o. male presents emergency department complaining of left-sided facial pain.  Patient states he was seen at a walk-in clinic over 1 week ago for sinus congestion and pain.  He was not placed on antibiotic.  He was told to take nasal sprays and over-the-counter decongestants.  He denies fever or chills but states that the headache has gotten much worse and now he is more congested.  No fever or chills.  No cough.    History reviewed. No pertinent past medical history.  There are no problems to display for this patient.   History reviewed. No pertinent surgical history.  Prior to Admission medications   Medication Sig Start Date End Date Taking? Authorizing Provider  amoxicillin (AMOXIL) 875 MG tablet Take 1 tablet (875 mg total) by mouth 2 (two) times daily. 04/20/20   Kalliopi Coupland, Roselyn Bering, PA-C  cetirizine (ZYRTEC) 10 MG tablet Take 10 mg by mouth daily.    [provider]  omeprazole (PRILOSEC) 20 MG capsule Take 1 capsule (20 mg total) by mouth daily. 12/20/18   Mickie Bail, NP  ondansetron (ZOFRAN) 4 MG tablet Take 1 tablet (4 mg total) by mouth every 8 (eight) hours as needed for nausea or vomiting. 12/20/18   Mickie Bail, NP  predniSONE (DELTASONE) 10 MG tablet Take 3 tablets (30 mg total) by mouth daily with breakfast. 04/20/20   Sherrie Mustache Roselyn Bering, PA-C  traMADol (ULTRAM) 50 MG tablet Take 1 tablet (50 mg total) by mouth every 6 (six) hours as needed. 04/20/20   Faythe Ghee, PA-C    Allergies Cat hair extract and Pollen extract  No family history on file.  Social History Social History   Tobacco Use  . Smoking status: Former Smoker     Types: Cigarettes  . Smokeless tobacco: Never Used  Vaping Use  . Vaping Use: Every day  Substance Use Topics  . Alcohol use: Yes  . Drug use: No    Review of Systems  Constitutional: No fever/chills Eyes: No visual changes. ENT: No sore throat.  Positive sinus pain Respiratory: Denies cough Genitourinary: Negative for dysuria. Musculoskeletal: Negative for back pain. Skin: Negative for rash. Psychiatric: no mood changes,     ____________________________________________   PHYSICAL EXAM:  VITAL SIGNS: ED Triage Vitals  Enc Vitals Group     BP 04/20/20 1135 131/88     Pulse Rate 04/20/20 1135 63     Resp 04/20/20 1135 20     Temp 04/20/20 1135 98.7 F (37.1 C)     Temp Source 04/20/20 1135 Oral     SpO2 04/20/20 1135 98 %     Weight 04/20/20 1132 275 lb (124.7 kg)     Height 04/20/20 1132 6\' 4"  (1.93 m)     Head Circumference --      Peak Flow --      Pain Score 04/20/20 1132 8     Pain Loc --      Pain Edu? --      Excl. in GC? --     Constitutional: Alert and oriented. Well appearing and in no acute distress. Eyes: Conjunctivae are normal.  Head: Atraumatic.  Left sinus  at maxillary and frontal are both very tender to palpation Nose: No congestion/rhinnorhea.  Positive mucosal swelling bilaterally Mouth/Throat: Mucous membranes are moist.  Throat appears normal Neck:  supple no lymphadenopathy noted Cardiovascular: Normal rate, regular rhythm. Heart sounds are normal Respiratory: Normal respiratory effort.  No retractions, lungs c t a  GU: deferred Musculoskeletal: FROM all extremities, warm and well perfused Neurologic:  Normal speech and language.  Skin:  Skin is warm, dry and intact. No rash noted. Psychiatric: Mood and affect are normal. Speech and behavior are normal.  ____________________________________________   LABS (all labs ordered are listed, but only abnormal results are displayed)  Labs Reviewed  RESP PANEL BY RT-PCR (FLU A&B, COVID)  ARPGX2   ____________________________________________   ____________________________________________  RADIOLOGY    ____________________________________________   PROCEDURES  Procedure(s) performed: No  Procedures    ____________________________________________   INITIAL IMPRESSION / ASSESSMENT AND PLAN / ED COURSE  Pertinent labs & imaging results that were available during my care of the patient were reviewed by me and considered in my medical decision making (see chart for details).   Patient is 29 year old male presents with acute headache and sinus pain.  See HPI.  Physical exam shows patient to have tenderness along the left frontal and maxillary sinuses.  Remainder exams unremarkable  Covid swab was obtained  I do feel patient has more of an acute sinusitis.  Due to the fact he has not had any antibiotics we will go ahead and treat him with antibiotics at this time.  He was given a prescription for amoxicillin, prednisone 30 mg for 3 days, and tramadol 50 mg 8 pills only due to the amount of pain patient is having.  Explained to him that if he is worsening he needs to return the emergency department.  If he is not improving in the next 2 to 3 days and feels that the pressure in the pain is still present he should return for CT of the maxillary sinuses.  The patient states he understands.  He was discharged in stable condition.  On recheck of labs covid swab is negative.     Brandon Molina was evaluated in Emergency Department on 04/20/2020 for the symptoms described in the history of present illness. He was evaluated in the context of the global COVID-19 pandemic, which necessitated consideration that the patient might be at risk for infection with the SARS-CoV-2 virus that causes COVID-19. Institutional protocols and algorithms that pertain to the evaluation of patients at risk for COVID-19 are in a state of rapid change based on information released by regulatory  bodies including the CDC and federal and state organizations. These policies and algorithms were followed during the patient's care in the ED.    As part of my medical decision making, I reviewed the following data within the electronic MEDICAL RECORD NUMBER Nursing notes reviewed and incorporated, Labs reviewed , Old chart reviewed, Notes from prior ED visits and [place] Controlled Substance Database  ____________________________________________   FINAL CLINICAL IMPRESSION(S) / ED DIAGNOSES  Final diagnoses:  Acute non-recurrent maxillary sinusitis  Suspected COVID-19 virus infection      NEW MEDICATIONS STARTED DURING THIS VISIT:  Discharge Medication List as of 04/20/2020 12:51 PM    START taking these medications   Details  amoxicillin (AMOXIL) 875 MG tablet Take 1 tablet (875 mg total) by mouth 2 (two) times daily., Starting Sun 04/20/2020, Normal    predniSONE (DELTASONE) 10 MG tablet Take 3 tablets (30 mg total) by  mouth daily with breakfast., Starting Sun 04/20/2020, Normal    traMADol (ULTRAM) 50 MG tablet Take 1 tablet (50 mg total) by mouth every 6 (six) hours as needed., Starting Sun 04/20/2020, Normal         Note:  This document was prepared using Dragon voice recognition software and may include unintentional dictation errors.    Faythe Ghee, PA-C 04/20/20 1517    Gilles Chiquito, MD 04/20/20 [phone]

## 2020-04-20 NOTE — ED Triage Notes (Signed)
Pt arrived via POV with reports of L side facial pain, was told he had sinus infection at a walk-in clinic. Pt coughing and having nasal congestion. Sxs began 1 week ago. Pt was not tested for COVID.  Pt was not prescribed any antibiotics.

## 2020-04-20 NOTE — Discharge Instructions (Addendum)
Continue your decongestant. Follow-up with your regular doctor as needed. Return emergency department worsening. Use medications as prescribed. You may continue to take Tylenol and ibuprofen for pain. Tramadol for pain not controlled by the previous medications.

## 2022-04-29 ENCOUNTER — Emergency Department (HOSPITAL_COMMUNITY)
Admission: EM | Admit: 2022-04-29 | Discharge: 2022-04-29 | Discharge status: Against Medical Advice | Payer: PRIVATE HEALTH INSURANCE

## 2022-04-29 NOTE — ED Notes (Signed)
The patient has been called x4 for triage with no answer

## 2024-04-01 ENCOUNTER — Ambulatory Visit
Admission: EM | Admit: 2024-04-01 | Discharge: 2024-04-01 | Disposition: A | Discharge status: Home / Self Care | Attending: Nurse Practitioner | Admitting: Nurse Practitioner

## 2024-04-01 ENCOUNTER — Encounter: Payer: Self-pay | Admitting: Emergency Medicine

## 2024-04-01 DIAGNOSIS — J069 Acute upper respiratory infection, unspecified: Secondary | ICD-10-CM | POA: Insufficient documentation

## 2024-04-01 DIAGNOSIS — J029 Acute pharyngitis, unspecified: Secondary | ICD-10-CM | POA: Diagnosis present

## 2024-04-01 LAB — POC COVID19/FLU A&B COMBO
Covid Antigen, POC: NEGATIVE
Influenza A Antigen, POC: NEGATIVE
Influenza B Antigen, POC: NEGATIVE

## 2024-04-01 LAB — POCT RAPID STREP A (OFFICE): Rapid Strep A Screen: NEGATIVE

## 2024-04-01 MED ORDER — BENZONATATE 100 MG PO CAPS: 100.0000 mg | ORAL_CAPSULE | Freq: Three times a day (TID) | ORAL | 0 refills | Status: DC | PRN

## 2024-04-01 MED ORDER — PROMETHAZINE-DM 6.25-15 MG/5ML PO SYRP: 5.0000 mL | ORAL_SOLUTION | Freq: Every evening | ORAL | 0 refills | Status: DC | PRN

## 2024-04-01 NOTE — Discharge Instructions (Addendum)
 You have a viral upper respiratory infection.  Symptoms should improve over the next week to 10 days.  If you develop chest pain or shortness of breath, go to the emergency room.  COVID-19, influenza, and rapid strep test is negative.  Throat culture is pending.    Some things that can make you feel better are: - Increased rest - Increasing fluid with water/sugar free electrolytes - Acetaminophen  and ibuprofen as needed for fever/pain - Salt water gargling, chloraseptic spray and throat lozenges - OTC guaifenesin (Mucinex) 600 mg twice daily for congestion. - Saline sinus flushes or a neti pot - Humidifying the air

## 2024-04-01 NOTE — ED Triage Notes (Signed)
 Pt c/o headache, shortness of breath, nasal congestion, lower back pain. Cough, chills, sweats. Started today, unsure if he has had a fever.

## 2024-04-01 NOTE — ED Provider Notes (Signed)
 [facility]    CSN: [id number] Arrival date & time: 04/01/24  1521      History   Chief Complaint Chief Complaint  Patient presents with   Sore Throat   Generalized Body Aches    HPI Brandon Molina is a 33 y.o. male.   Patient today with 1 day history of hot and cold chills, congested cough, chest nasal congestion, runny nose, sore throat, headache, nausea, decreased appetite, and fatigue.  No fever, shortness of breath or chest pain, ear pain, vomiting, diarrhea.  Reports his children are sick with similar symptoms.  Has taken Tylenol  with minimal temporary improvement.    History reviewed. No pertinent past medical history.  There are no active problems to display for this patient.   History reviewed. No pertinent surgical history.     Home Medications    Prior to Admission medications   Medication Sig Start Date End Date Taking? Authorizing Provider  benzonatate (TESSALON) 100 MG capsule Take 1 capsule (100 mg total) by mouth 3 (three) times daily as needed for cough. Do not take with alcohol or while operating or driving heavy machinery 88/83/74  Yes Chandra Raisin A, NP  promethazine-dextromethorphan (PROMETHAZINE-DM) 6.25-15 MG/5ML syrup Take 5 mLs by mouth at bedtime as needed for cough. Do not take with alcohol or while driving or operating heavy machinery.  May cause drowsiness. 04/01/24  Yes Chandra Raisin LABOR, NP  cetirizine (ZYRTEC) 10 MG tablet Take 10 mg by mouth daily.    [provider]  omeprazole  (PRILOSEC) 20 MG capsule Take 1 capsule (20 mg total) by mouth daily. 12/20/18   Corlis Burnard DEL, NP  ondansetron  (ZOFRAN ) 4 MG tablet Take 1 tablet (4 mg total) by mouth every 8 (eight) hours as needed for nausea or vomiting. 12/20/18   Corlis Burnard DEL, NP    Family History History reviewed. No pertinent family history.  Social History Social History   Tobacco Use   Smoking status: Former    Types: Cigarettes   Smokeless tobacco:  Never  Vaping Use   Vaping status: Every Day  Substance Use Topics   Alcohol use: Yes   Drug use: No     Allergies   Cat dander and Pollen extract   Review of Systems Review of Systems Per HPI  Physical Exam Triage Vital Signs ED Triage Vitals [04/01/24 1539]  Encounter Vitals Group     BP      Girls Systolic BP Percentile      Girls Diastolic BP Percentile      Boys Systolic BP Percentile      Boys Diastolic BP Percentile      Pulse      Resp      Temp      Temp src      SpO2      Weight 274 lb 14.6 oz (124.7 kg)     Height 6' 4 (1.93 m)     Head Circumference      Peak Flow      Pain Score 5     Pain Loc      Pain Education      Exclude from Growth Chart    No data found.  Updated Vital Signs BP 130/86 (BP Location: Right Arm)   Pulse 82   Temp 98 F (36.7 C) (Oral)   Resp 16   Ht 6' 4 (1.93 m)   Wt 274 lb 14.6 oz (124.7 kg)   SpO2 95%  BMI 33.46 kg/m   Visual Acuity Right Eye Distance:   Left Eye Distance:   Bilateral Distance:    Right Eye Near:   Left Eye Near:    Bilateral Near:     Physical Exam Vitals and nursing note reviewed.  Constitutional:      General: He is not in acute distress.    Appearance: Normal appearance. He is not ill-appearing or toxic-appearing.  HENT:     Head: Normocephalic and atraumatic.     Right Ear: Tympanic membrane, ear canal and external ear normal. No drainage, swelling or tenderness. No middle ear effusion. Tympanic membrane is not erythematous.     Left Ear: Tympanic membrane, ear canal and external ear normal. No drainage, swelling or tenderness.  No middle ear effusion. Tympanic membrane is not erythematous.     Nose: Congestion present. No rhinorrhea.     Mouth/Throat:     Mouth: Mucous membranes are moist.     Pharynx: Oropharynx is clear. Posterior oropharyngeal erythema present. No oropharyngeal exudate.     Tonsils: No tonsillar exudate.  Eyes:     General: No scleral icterus.     Extraocular Movements: Extraocular movements intact.  Cardiovascular:     Rate and Rhythm: Normal rate and regular rhythm.  Pulmonary:     Effort: Pulmonary effort is normal. No respiratory distress.     Breath sounds: Normal breath sounds. No wheezing, rhonchi or rales.  Musculoskeletal:     Cervical back: Normal range of motion and neck supple.  Lymphadenopathy:     Cervical: No cervical adenopathy.  Skin:    General: Skin is warm and dry.     Coloration: Skin is not jaundiced or pale.     Findings: No erythema or rash.  Neurological:     Mental Status: He is alert and oriented to person, place, and time.  Psychiatric:        Behavior: Behavior is cooperative.      UC Treatments / Results  Labs (all labs ordered are listed, but only abnormal results are displayed) Labs Reviewed  POCT RAPID STREP A (OFFICE) - Normal  POC COVID19/FLU A&B COMBO - Normal  CULTURE, GROUP A STREP [facility])    EKG   Radiology No results found.  Procedures Procedures (including critical care time)  Medications Ordered in UC Medications - No data to display  Initial Impression / Assessment and Plan / UC Course  I have reviewed the triage vital signs and the nursing notes.  Pertinent labs & imaging results that were available during my care of the patient were reviewed by me and considered in my medical decision making (see chart for details).   In triage, vital signs are stable and patient is well-appearing.  Rapid strep negative, COVID-19 and influenza testing is negative.  Throat culture is pending given posterior pharynx erythema.  Suspect viral etiology.  Supportive measures discussed, start cough suppressant.  Return and ER precautions discussed.  Work excuse provided.  The patient was given the opportunity to ask questions.  All questions answered to their satisfaction.  The patient is in agreement to this plan.   Final Clinical Impressions(s) / UC Diagnoses   Final diagnoses:  Acute  pharyngitis, unspecified etiology  Viral URI with cough     Discharge Instructions      You have a viral upper respiratory infection.  Symptoms should improve over the next week to 10 days.  If you develop chest pain or shortness of breath, go to  the emergency room.  COVID-19, influenza, and rapid strep test is negative.  Throat culture is pending.    Some things that can make you feel better are: - Increased rest - Increasing fluid with water/sugar free electrolytes - Acetaminophen  and ibuprofen as needed for fever/pain - Salt water gargling, chloraseptic spray and throat lozenges - OTC guaifenesin (Mucinex) 600 mg twice daily for congestion. - Saline sinus flushes or a neti pot - Humidifying the air     ED Prescriptions     Medication Sig Dispense Auth. Provider   benzonatate (TESSALON) 100 MG capsule Take 1 capsule (100 mg total) by mouth 3 (three) times daily as needed for cough. Do not take with alcohol or while operating or driving heavy machinery 21 capsule Chandra Raisin A, NP   promethazine-dextromethorphan (PROMETHAZINE-DM) 6.25-15 MG/5ML syrup Take 5 mLs by mouth at bedtime as needed for cough. Do not take with alcohol or while driving or operating heavy machinery.  May cause drowsiness. 118 mL Chandra Raisin LABOR, NP      PDMP not reviewed this encounter.   Chandra Raisin LABOR, NP 04/01/24 [phone]

## 2024-04-04 ENCOUNTER — Ambulatory Visit (HOSPITAL_COMMUNITY): Payer: Self-pay

## 2024-04-04 LAB — CULTURE, GROUP A STREP (THRC)

## 2024-04-11 ENCOUNTER — Ambulatory Visit: Payer: PRIVATE HEALTH INSURANCE | Admitting: Family Medicine

## 2024-04-11 ENCOUNTER — Encounter: Payer: Self-pay | Admitting: Family Medicine

## 2024-04-11 ENCOUNTER — Ambulatory Visit (INDEPENDENT_AMBULATORY_CARE_PROVIDER_SITE_OTHER): Admitting: Family Medicine

## 2024-04-11 ENCOUNTER — Ambulatory Visit: Payer: Self-pay | Admitting: Family Medicine

## 2024-04-11 ENCOUNTER — Ambulatory Visit

## 2024-04-11 VITALS — BP 122/88 | HR 70 | Temp 98.5°F | Ht 76.0 in | Wt 308.0 lb

## 2024-04-11 DIAGNOSIS — G4700 Insomnia, unspecified: Secondary | ICD-10-CM

## 2024-04-11 DIAGNOSIS — M545 Low back pain, unspecified: Secondary | ICD-10-CM

## 2024-04-11 DIAGNOSIS — Z6837 Body mass index (BMI) 37.0-37.9, adult: Secondary | ICD-10-CM

## 2024-04-11 DIAGNOSIS — E669 Obesity, unspecified: Secondary | ICD-10-CM | POA: Diagnosis not present

## 2024-04-11 DIAGNOSIS — G8929 Other chronic pain: Secondary | ICD-10-CM

## 2024-04-11 DIAGNOSIS — Z7689 Persons encountering health services in other specified circumstances: Secondary | ICD-10-CM

## 2024-04-11 DIAGNOSIS — J3089 Other allergic rhinitis: Secondary | ICD-10-CM

## 2024-04-11 MED ORDER — MELOXICAM 15 MG PO TABS: 15.0000 mg | ORAL_TABLET | Freq: Every day | ORAL | 0 refills | Status: DC

## 2024-04-11 NOTE — Patient Instructions (Signed)
 Please go downstairs for an x-ray before you leave  Start the meloxicam  daily with food.  This is an anti-inflammatory pain medicine.  If needed, you can also take Tylenol  500 mg or 1000 mg twice daily.  You can use a heating pad or an ice pack and do stretches.  I referred you to [facility] sports medicine and they will call you to schedule a visit.  In regards to sleep, retrained yourself to be able to go to bed and go to sleep.  Avoid TV, cell phone, computers, eating or drinking caffeine 2 to 3 hours before bedtime.  We will discuss this again at your follow-up

## 2024-04-11 NOTE — Progress Notes (Signed)
 New Patient Office Visit  Subjective    Patient ID: Brandon Molina, male    DOB: 1990/10/11  Age: 33 y.o. MRN: [id number]  CC:  Chief Complaint  Patient presents with   Establish Care    C/o lower back pain doing simple things ongoing 6-7 mths  trouble sleeping at night ongoing for 6-7 mths since stopping marijuana for job    Discussed the use of AI scribe software for clinical note transcription with the patient, who gave verbal consent to proceed.  History of Present Illness Brandon Molina is a 33 year old male who presents with back pain and sleep issues.  Lumbosacral pain with radiculopathy - Middle lower back pain for approximately four years, worsened after a fall while using a backpack blower - Pain is constant, rated 5-6/10, sometimes worse - Pain radiates into both buttocks  - Pain impairs ability to get out of bed and perform physical work - No leg weakness or numbness - No prior evaluation due to lack of insurance - Takes OTC Tylenol  500 mg one to two times daily, providing a few hours of relief  Sleep disturbance - Sleep disturbance for six to seven months, onset after cessation of marijuana use - Works 12-hour shifts from 7 AM to 7 PM, has difficulty winding down after work - Typically sleeps 4-5 hours per night, occasionally 6-7 hours - Melatonin and OTC sleep aids ineffective - No depression - Generalized anxiety present - Has a 53-month-old child who does not usually wake him at night  Chronic allergic rhinitis - Chronic year-round allergies, worsened by weather changes - Uses OTC Tylenol , Zyrtec, and a nasal spray (likely Flonase) for symptom control  Obesity - BMI 37  Hypertension - History of elevated blood pressure, improved with increased physical activity  Substance use - Occasional alcohol consumption - Vapes - No other recreational drug use  Occupational factors - Works in clinical cytogeneticist, 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   meloxicam  (MOBIC ) 15 MG tablet Take 1 tablet (15 mg total) by mouth daily.   [DISCONTINUED] benzonatate  (TESSALON ) 100 MG capsule Take 1 capsule (100 mg total) by mouth 3 (three) times daily as needed for cough. Do not take with alcohol or while operating or driving heavy machinery   [DISCONTINUED] cetirizine (ZYRTEC) 10 MG tablet Take 10 mg by mouth daily.   [DISCONTINUED] omeprazole  (PRILOSEC) 20 MG capsule Take 1 capsule (20 mg total) by mouth daily.   [DISCONTINUED] ondansetron  (ZOFRAN ) 4 MG tablet Take 1 tablet (4 mg total) by mouth every 8 (eight) hours as needed for nausea or vomiting.   [DISCONTINUED] promethazine -dextromethorphan (PROMETHAZINE -DM) 6.25-15 MG/5ML syrup Take 5 mLs by mouth at bedtime as needed for cough. Do not take with alcohol or while driving or operating heavy machinery.  May cause drowsiness.   No facility-administered encounter medications on file as of 04/11/2024.    History reviewed. No pertinent past medical history.  History reviewed. No pertinent surgical history.  History reviewed. No pertinent family history.  Social History   Socioeconomic History   Marital status: Single    Spouse name: Not on file   Number of children: Not on file   Years of education: Not on file   Highest education level: Not on file  Occupational History   Not on file  Tobacco Use  Smoking status: Former    Types: Cigarettes   Smokeless tobacco: Never  Vaping Use   Vaping status: Every Day  Substance and Sexual Activity   Alcohol use: Yes   Drug use: No   Sexual activity: Not on file  Other Topics Concern   Not on file  Social History Narrative   Not on file   Social Drivers of Health   Financial Resource Strain: Low Risk  (04/04/2023)   Received from [facility]   Overall Financial Resource Strain  (CARDIA)    Difficulty of Paying Living Expenses: Not hard at all  Food Insecurity: No Food Insecurity (04/04/2023)   Received from [facility]   Hunger Vital Sign    Within the past 12 months, you worried that your food would run out before you got the money to buy more.: Never true    Within the past 12 months, the food you bought just didn't last and you didn't have money to get more.: Never true  Transportation Needs: No Transportation Needs (04/04/2023)   Received from [facility] - Transportation    In the past 12 months, has lack of transportation kept you from medical appointments or from getting medications?: No    Lack of Transportation (Non-Medical): No  Physical Activity: Not on file  Stress: Not on file  Social Connections: Not on file  Intimate Partner Violence: Not on file    ROS Per HPI     Objective    BP 122/88 (BP Location: Right Arm, Patient Position: Sitting)   Pulse 70   Temp 98.5 F (36.9 C) (Temporal)   Ht 6' 4 (1.93 m)   Wt (!) 308 lb (139.7 kg)   SpO2 95%   BMI 37.49 kg/m   Physical Exam Constitutional:      General: He is not in acute distress.    Appearance: He is not ill-appearing.  Eyes:     Extraocular Movements: Extraocular movements intact.     Conjunctiva/sclera: Conjunctivae normal.  Cardiovascular:     Rate and Rhythm: Normal rate.  Pulmonary:     Effort: Pulmonary effort is normal.  Musculoskeletal:        General: Normal range of motion.     Cervical back: Normal range of motion and neck supple.     Comments: No significant ROM limitations of back   Skin:    General: Skin is warm and dry.  Neurological:     General: No focal deficit present.     Mental Status: He is alert and oriented to person, place, and time.     Cranial Nerves: No cranial nerve deficit.     Motor: No weakness.     Coordination: Coordination normal.     Gait: Gait normal.  Psychiatric:        Mood and  Affect: Mood normal.        Behavior: Behavior normal.        Thought Content: Thought content normal.         Assessment & Plan:   Problem List Items Addressed This Visit   None Visit Diagnoses       Chronic bilateral low back pain without sciatica    -  Primary   Relevant Medications   meloxicam  (MOBIC ) 15 MG tablet   Other Relevant Orders   DG Lumbar Spine 2-3 Views (Completed)     Obesity (BMI 30-39.9)  Encounter to establish care         Environmental and seasonal allergies         Insomnia, unspecified type           Assessment and Plan Assessment & Plan Low back pain Chronic low back pain for four years, exacerbated by a fall two and a half to three years ago. Pain is located in the lower back, radiating bilaterally into buttocks with a severity of 5-6/10. Pain is sometimes severe enough to interfere with daily activities and sleep. No prior medical evaluation or treatment. Tylenol  provides temporary relief. No neurological deficits reported. - Ordered lumbar spine x-ray to assess for degenerative changes or other abnormalities. - Prescribed meloxicam  for anti-inflammatory effect. - Referred to sports medicine for further evaluation and management, including potential interventions such as injections or physical therapy. - Advised use of heating pad and topical pain relief as needed. - Instructed to avoid over-the-counter NSAIDs except Tylenol .  Obesity BMI of 37. Discussed lifestyle modifications including dietary changes and portion control. Emphasized the impact of meal timing on weight gain, especially with late-night eating habits. - Advised on dietary modifications focusing on reducing carbohydrate intake and portion control. - Discussed the importance of meal timing, particularly avoiding large meals close to bedtime.  Chronic allergic rhinitis Year-round allergies with exacerbation during weather changes. Currently managed with over-the-counter  medications including Zyrtec and Flonase. - Continue current allergy management with Zyrtec and Flonase as needed.  Insomnia Chronic insomnia for six to seven months, exacerbated by lifestyle factors including late-night work hours and caring for a 15 months-month-old. Previous cessation of marijuana use. Sleep hygiene issues noted, including late meals and screen time before bed. No depression, but general anxiety present. Melatonin and other over-the-counter sleep aids have been ineffective. - Advised on sleep hygiene practices, including avoiding late meals, screen time, and caffeine before bed. - Recommended establishing a bedtime routine to promote sleep onset. - Suggested low-dose melatonin as an adjunct to sleep hygiene practices.     Return for fasting CPE at their convenience.   Boby Mackintosh, NP-C

## 2024-04-23 ENCOUNTER — Ambulatory Visit: Admitting: Family Medicine

## 2024-04-23 VITALS — BP 112/80 | HR 57 | Ht 76.0 in | Wt 312.0 lb

## 2024-04-23 DIAGNOSIS — M545 Low back pain, unspecified: Secondary | ICD-10-CM

## 2024-04-23 DIAGNOSIS — G8929 Other chronic pain: Secondary | ICD-10-CM

## 2024-04-23 MED ORDER — TIZANIDINE HCL 2 MG PO TABS: 2.0000 mg | ORAL_TABLET | Freq: Three times a day (TID) | ORAL | 1 refills | Status: AC | PRN

## 2024-04-23 NOTE — Progress Notes (Signed)
   LILLETTE Ileana Collet, PhD, LAT, ATC acting as a scribe for Artist Lloyd, MD.  Brandon Molina is a 33 y.o. male who presents to [facility] today for LBP ongoing for 4 years, worsening over the last 6-7 months. Pain seemed to be exacerbated from a fall when using a backpack blower. Pt locates pain to midline of his low back.  Radiating pain: slightly into buttocks LE numbness/tingling: now LE weakness: no Aggravates: bending over Treatments tried: meloxicam , tylenol ,   Dx imaging: 04/11/24 L-spine XR  Pertinent review of systems: No fevers or chills  Relevant historical information: GERD   Exam:  BP 112/80   Pulse (!) 57   Ht 6' 4 (1.93 m)   Wt (!) 312 lb (141.5 kg)   SpO2 97%   BMI 37.98 kg/m  General: Well Developed, well nourished, and in no acute distress.   MSK: L-spine: Normal-appearing Nontender palpation spinal midline. Decreased lumbar motion. Lower extremity strength is intact    Lab and Radiology Results   EXAM: 2 or 3 VIEW(S) XRAY OF THE LUMBAR SPINE 04/11/2024 10:54:41 AM   COMPARISON: CT scan 08/23/2012.   CLINICAL HISTORY: low back pain x 2 years, interferes with sleep   FINDINGS:   LUMBAR SPINE: BONES: 10 degrees levoconvex lumbar scoliosis is measured between L1 and L5 with minimal rotatory component. No acute fracture. No aggressive appearing osseous lesion. Alignment is normal.   DISCS AND DEGENERATIVE CHANGES: Preserved intervertebral disc height.   SOFT TISSUES: No acute abnormality.   LIMITATIONS/ARTIFACTS: Body habitus reduces diagnostic sensitivity and specificity.   IMPRESSION: 1. No acute abnormality of the lumbar spine. 2. 10 degrees levoconvex lumbar scoliosis between L1 and L5 with minimal rotatory component.   Electronically signed by: Ryan Salvage MD 04/11/2024 11:17 AM EST RP Workstation: [id number] LILLETTE Artist Lloyd, personally (independently) visualized and performed the  interpretation of the images attached in this note.     Assessment and Plan: 33 y.o. male with chronic low back pain.  Patient did have an injury in the past but no recent or acute injury.  He does have some mild scoliosis and degenerative changes seen on x-ray lumbar spine from less than a month ago.  Dominant source of pain is muscle dysfunction perispinal muscle groups.  Plan for physical therapy.  Continue meloxicam  and add Tylenol  arthritis as needed.  Additionally prescribed tizanidine  to use primarily at bedtime as needed.  Check back if not improved.   PDMP not reviewed this encounter. Orders Placed This Encounter  Procedures   Ambulatory referral to Physical Therapy    Referral Priority:   Routine    Referral Type:   Physical Medicine    Referral Reason:   Specialty Services Required    Requested Specialty:   Physical Therapy    Number of Visits Requested:   1   Meds ordered this encounter  Medications   tiZANidine  (ZANAFLEX ) 2 MG tablet    Sig: Take 1-2 tablets (2-4 mg total) by mouth every 8 (eight) hours as needed.    Dispense:  60 tablet    Refill:  1     Discussed warning signs or symptoms. Please see discharge instructions. Patient expresses understanding.   The above documentation has been reviewed and is accurate and complete Artist Lloyd, M.D.

## 2024-04-23 NOTE — Patient Instructions (Addendum)
 Thank you for coming in today.   I've sent a prescription for Tizanidine  to your pharmacy. Can also take Tylenol  Arthritis.  I've referred you to Physical Therapy.  Let us  know if you don't hear from them in one week.

## 2024-05-07 NOTE — Therapy (Incomplete)
 " OUTPATIENT PHYSICAL THERAPY THORACOLUMBAR EVALUATION   Patient Name: Brandon Molina MRN: [id number] DOB:10-Sep-1990, 33 y.o., male Today's Date: 05/07/2024  END OF SESSION:   No past medical history on file. No past surgical history on file. Patient Active Problem List   Diagnosis Date Noted   Class 2 obesity with body mass index (BMI) of 36.0 to 36.9 in adult 07/20/2016   Gastroesophageal reflux disease 07/20/2016   Tobacco use 07/20/2016    PCP: Boby Mackintosh, NP-C  REFERRING PROVIDER: Artist Lloyd, MD  REFERRING DIAG: M54.50,G89.29 (ICD-10-CM) - Chronic midline low back pain without sciatica  Rationale for Evaluation and Treatment: Rehabilitation  THERAPY DIAG:  No diagnosis found.  ONSET DATE: ***  SUBJECTIVE:                                                                                                                                                                                           SUBJECTIVE STATEMENT: ***  PERTINENT HISTORY:  ***  PAIN:  Are you having pain? Yes: NPRS scale: *** Pain location: *** Pain description: *** Aggravating factors: *** Relieving factors: ***  PRECAUTIONS: {Therapy precautions:24002}  RED FLAGS: {PT Red Flags:29287}   WEIGHT BEARING RESTRICTIONS: No  FALLS:  Has patient fallen in last 6 months? {fallsyesno:27318}  LIVING ENVIRONMENT: Lives with: {[facility] lives with:25569::lives with their family} Lives in: {Lives in:25570} Stairs: {opstairs:27293} Has following equipment at home: {Assistive devices:23999}  OCCUPATION: ***  PLOF: {PLOF:24004}  PATIENT GOALS: ***  NEXT MD VISIT: ***  OBJECTIVE:  Note: Objective measures were completed at Evaluation unless otherwise noted.  DIAGNOSTIC FINDINGS:  IMPRESSION: 1. No acute abnormality of the lumbar spine. 2. 10 degrees levoconvex lumbar scoliosis between L1 and L5 with minimal rotatory component.  PATIENT SURVEYS:  PSFS: THE PATIENT SPECIFIC FUNCTIONAL  SCALE  Place score of 0-10 (0 = unable to perform activity and 10 = able to perform activity at the same level as before injury or problem)  Activity Date: 05/08/2024         2.     3.     4.      Total Score ***      Total Score = Sum of activity scores/number of activities  Minimally Detectable Change: 3 points (for single activity); 2 points (for average score)  [facility] (nd). The Patient Specific Functional Scale . Retrieved from [email or web address]   COGNITION: Overall cognitive status: {cognition:24006}     SENSATION: {sensation:27233}  MUSCLE LENGTH: Hamstrings: Right *** deg; Left *** deg Debby test: Right *** deg; Left *** deg  POSTURE: {posture:25561}  PALPATION: ***  LUMBAR ROM:  AROM eval  Flexion   Extension   Right lateral flexion   Left lateral flexion   Right rotation   Left rotation    (Blank rows = not tested)  LOWER EXTREMITY ROM:     {AROM/PROM:27142}  Right eval Left eval  Hip flexion    Hip extension    Hip abduction    Hip adduction    Hip internal rotation    Hip external rotation    Knee flexion    Knee extension    Ankle dorsiflexion    Ankle plantarflexion    Ankle inversion    Ankle eversion     (Blank rows = not tested)  LOWER EXTREMITY MMT:    MMT Right eval Left eval  Hip flexion    Hip extension    Hip abduction    Hip adduction    Hip internal rotation    Hip external rotation    Knee flexion    Knee extension    Ankle dorsiflexion    Ankle plantarflexion    Ankle inversion    Ankle eversion     (Blank rows = not tested)  LUMBAR SPECIAL TESTS:  {lumbar special test:25242}  FUNCTIONAL TESTS:  {Functional tests:24029}  GAIT: Distance walked: *** Assistive device utilized: {Assistive devices:23999} Level of assistance: {Levels of assistance:24026} Comments: ***  TREATMENT DATE: ***                                                                                                                                  PATIENT EDUCATION:  Education details: *** Person educated: {Person educated:25204} Education method: {Education Method:25205} Education comprehension: {Education Comprehension:25206}  HOME EXERCISE PROGRAM: ***  ASSESSMENT:  CLINICAL IMPRESSION: Patient is a *** y.o. *** who was seen today for physical therapy evaluation and treatment for ***.   OBJECTIVE IMPAIRMENTS: {opptimpairments:25111}.   ACTIVITY LIMITATIONS: {activitylimitations:27494}  PARTICIPATION LIMITATIONS: {participationrestrictions:25113}  PERSONAL FACTORS: {Personal factors:25162} are also affecting patient's functional outcome.   REHAB POTENTIAL: {rehabpotential:25112}  CLINICAL DECISION MAKING: {clinical decision making:25114}  EVALUATION COMPLEXITY: {Evaluation complexity:25115}   GOALS: Goals reviewed with patient? {yes/no:20286}  SHORT TERM GOALS: Target date: ***  *** Baseline: Goal status: INITIAL  2.  *** Baseline:  Goal status: INITIAL  3.  *** Baseline:  Goal status: INITIAL  4.  *** Baseline:  Goal status: INITIAL  5.  *** Baseline:  Goal status: INITIAL  6.  *** Baseline:  Goal status: INITIAL  LONG TERM GOALS: Target date: ***  *** Baseline:  Goal status: INITIAL  2.  *** Baseline:  Goal status: INITIAL  3.  *** Baseline:  Goal status: INITIAL  4.  *** Baseline:  Goal status: INITIAL  5.  *** Baseline:  Goal status: INITIAL  6.  *** Baseline:  Goal status: INITIAL  PLAN:  PT FREQUENCY: {rehab frequency:25116}  PT DURATION: {rehab duration:25117}  PLANNED INTERVENTIONS: {rehab planned interventions:25118::97110-Therapeutic exercises,97530- Therapeutic [id number]- Neuromuscular re-education,97535- Self Rjmz,02859-  Manual therapy,Patient/Family education}.  PLAN FOR NEXT SESSION: ***   Susannah Daring, PT, DPT 05/07/2024 3:45 PM    "

## 2024-05-08 ENCOUNTER — Encounter: Payer: Self-pay | Admitting: Rehabilitative and Restorative Service Providers"

## 2024-05-08 ENCOUNTER — Other Ambulatory Visit: Payer: Self-pay | Admitting: Family Medicine

## 2024-05-08 ENCOUNTER — Ambulatory Visit (INDEPENDENT_AMBULATORY_CARE_PROVIDER_SITE_OTHER): Admitting: Rehabilitative and Restorative Service Providers"

## 2024-05-08 DIAGNOSIS — M5459 Other low back pain: Secondary | ICD-10-CM

## 2024-05-08 DIAGNOSIS — R293 Abnormal posture: Secondary | ICD-10-CM

## 2024-05-08 NOTE — Therapy (Signed)
 " OUTPATIENT PHYSICAL THERAPY EVALUATION   Patient Name: Brandon Molina MRN: [id number] DOB:1991-03-17, 33 y.o., male Today's Date: 05/08/2024  END OF SESSION:  PT End of Session - 05/08/24 0806     Visit Number 1    Number of Visits 20    Date for Recertification  07/17/24    Authorization Type [employer] - 20% coinsurance, 60 VL 2025    PT Start Time 0800    PT Stop Time 0836    PT Time Calculation (min) 36 min    Activity Tolerance Patient tolerated treatment well    Behavior During Therapy [facility] for tasks assessed/performed          History reviewed. No pertinent past medical history. History reviewed. No pertinent surgical history. Patient Active Problem List   Diagnosis Date Noted   Class 2 obesity with body mass index (BMI) of 36.0 to 36.9 in adult 07/20/2016   Gastroesophageal reflux disease 07/20/2016   Tobacco use 07/20/2016    PCP: Lendia Nordmann NP  REFERRING PROVIDER: Joane Artist RAMAN, MD  REFERRING DIAG: M54.50,G89.29 (ICD-10-CM) - Chronic midline low back pain without sciatica  Rationale for Evaluation and Treatment: Rehabilitation  THERAPY DIAG:  Other low back pain  Abnormal posture  ONSET DATE:   SUBJECTIVE:                                                                                                                                                                                           SUBJECTIVE STATEMENT: Pt indicated about 3-4 years ago he fell with backpack blower on and had pain progressively worse over time.  Pt indicated feeling pain in lower back and into tailbone region.  Pt indicated trouble sitting prolonged, specifically on floor.  Reported bending, pulling repetively at work can be painful.  Pt indicated not missing work due to pain but having severe.   See pain section for more information.  Denied leg symptoms.   Pt indicated getting mildly worse over last month.   PERTINENT HISTORY:  Chronic back pain with worsening over last 6-7  months.   PAIN:  NPRS scale: at worst 8/10, at current 4-5/10 Pain location: back pain Pain description: hurts Aggravating factors: sitting prolonged, bending, holding kids, lifting around house, Relieving factors: OTC medicine, heat pad  PRECAUTIONS: None  WEIGHT BEARING RESTRICTIONS: No  FALLS:  Has patient fallen in last 6 months? No  LIVING ENVIRONMENT: Stairs: has stairs for back entry   OCCUPATION:  Has to pull and push weighted items  PLOF: Independent, has small kids, work on car, play video games.   PATIENT GOALS: Reduce pain  OBJECTIVE:   DIAGNOSTIC FINDINGS:  IMPRESSION: 1. No acute abnormality of the lumbar spine. 2. 10 degrees levoconvex lumbar scoliosis between L1 and L5 with minimal rotatory component.  PATIENT SURVEYS:  Patient-Specific Activity Scoring Scheme  0 represents unable to perform. 10 represents able to perform at prior level. 0 1 2 3 4 5 6 7 8 9  10 (Date and Score)   Activity Eval  05/08/2024    1. Sitting on floor/car  5    2. Doing job  6    3. Able hold my kids 6   4. Help around the house 7   5. Work on my vehicle  6   Score 6 avg    Total score = sum of the activity scores/number of activities Minimum detectable change (90%CI) for average score = 2 points Minimum detectable change (90%CI) for single activity score = 3 points  SCREENING FOR RED FLAGS: 05/08/2024 Bowel or bladder incontinence: No Cauda equina syndrome: No  COGNITION: 05/08/2024 Overall cognitive status: WFL normal      SENSATION: 05/08/2024 [facility]  MUSCLE LENGTH: 05/08/2024 Passive SLR Rt 25 deg c back pain noted Passive SLR Lt 30 deg c back pain noted   Supine hamstring 90/90 Rt: 60 deg from knee straight.  Lt not measured today.   POSTURE:  05/08/2024 Mild reduced lumbar lordosis   PALPATION: 05/08/2024  PAIVM:  L4, L5 cPA mild hypomobility with concordant pain noted.   LUMBAR ROM:  05/08/2024 Directional Preference  Assessment: Centralization: none  Peripheralization: none  AROM Eval 05/08/2024  Flexion To knees, tightness in back   Extension 75% WFL  Repeated in standing, improved to 100%  Right lateral flexion To knee joint no complaints  Left lateral flexion To knee joint no complaints  Right rotation   Left rotation    (Blank rows = not tested)  LOWER EXTREMITY ROM:      Right Eval 05/08/2024 Left Eval 05/08/2024  Hip flexion    Hip extension    Hip abduction    Hip adduction    Hip internal rotation    Hip external rotation    Knee flexion    Knee extension    Ankle dorsiflexion    Ankle plantarflexion    Ankle inversion    Ankle eversion     (Blank rows = not tested)  LOWER EXTREMITY MMT:    MMT Right Eval 05/08/2024 Left Eval 05/08/2024  Hip flexion 5/5 5/5  Hip extension    Hip abduction    Hip adduction    Hip internal rotation    Hip external rotation    Knee flexion 5/5 5/5  Knee extension 5/5 5/5  Ankle dorsiflexion    Ankle plantarflexion    Ankle inversion    Ankle eversion     (Blank rows = not tested)  LUMBAR SPECIAL TESTS:  05/08/2024 (-) slump bilateral, (-) Crossed SLR bilaterally   FUNCTIONAL TESTS:  05/08/2024 18 inch chair transfer s UE assist on 1st attempt  GAIT: 05/08/2024 Independent ambulation  TODAY'S TREATMENT:                                                                                                         DATE: 05/08/2024  Therex:    HEP instruction/performance c cues for techniques, handout provided.  Trial set performed of each for comprehension and symptom assessment.  See below for exercise list.  Neuro Re-ed Pain neuroscience education given regarding nervous system elevation/sensitivity increases and how  that can impact sensation and response to activity. Detailed importance of graded exercise (HEP, aerobic exercise options) and mobility during the day to promote reduced sensitivity.  Question and answer time given for improved understanding.  Also discussed pain monitoring scale for adaptive activity with focus on keeping symptoms 0-3/10.   PATIENT EDUCATION:  05/08/2024 Education details: HEP, POC Person educated: Patient Education method: Explanation, Demonstration, Verbal cues, and Handouts Education comprehension: verbalized understanding, returned demonstration, and verbal cues required  HOME EXERCISE PROGRAM: Access Code: [email or web address] URL: [email or web address] Date: 05/08/2024 Prepared by: Ozell Silvan  Exercises - Standing Lumbar Extension  - 2-4 x daily - 7 x weekly - 1 sets - 5-10 reps - Supine Lower Trunk Rotation  - 2-3 x daily - 7 x weekly - 1 sets - 3-5 reps - 15 hold - Supine Figure 4 pull towards  - 2-3 x daily - 7 x weekly - 1 sets - 5 reps - 15-30 hold - Supine 90/90 Sciatic Nerve Glide with Knee Flexion/Extension  - 1-2 x daily - 7 x weekly - 1-2 sets - 10 reps  ASSESSMENT:  CLINICAL IMPRESSION: Patient is a 33 y.o. who comes to clinic with complaints of chronic back pain with mobility and movement coordination deficits that impair their ability to perform usual daily and recreational functional activities without increase difficulty/symptoms at this time.  Patient to benefit from skilled PT services to address impairments and limitations to improve to previous level of function without restriction secondary to condition.   OBJECTIVE IMPAIRMENTS: decreased activity tolerance, decreased coordination, decreased endurance, decreased mobility, decreased ROM, hypomobility, increased fascial restrictions, impaired perceived functional ability, increased muscle spasms, impaired flexibility, improper body mechanics, postural dysfunction, and pain.   ACTIVITY  LIMITATIONS: carrying, lifting, bending, sitting, standing, squatting, sleeping, transfers, and locomotion level  PARTICIPATION LIMITATIONS: meal prep, cleaning, laundry, interpersonal relationship, driving, shopping, community activity, occupation, and yard work  PERSONAL FACTORS: Time since onset of injury/illness/exacerbation are also affecting patient's functional outcome.   REHAB POTENTIAL: Good  CLINICAL DECISION MAKING: Stable/uncomplicated  EVALUATION COMPLEXITY: Low   GOALS: Goals reviewed with patient? Yes  SHORT TERM GOALS: (target date for Short term goals are 3 weeks 05/29/2024)  1. Patient will demonstrate independent use of home exercise program to maintain progress from in clinic treatments.  Goal status: New  LONG TERM GOALS: (target dates for all long term goals are 10 weeks  07/17/2024 )   1. Patient will demonstrate/report pain at worst less than or equal to 2/10 to facilitate minimal limitation in daily activity secondary to pain symptoms.  Goal status: New   2.  Patient will demonstrate independent use of home exercise program to facilitate ability to maintain/progress functional gains from skilled physical therapy services.  Goal status: New   3. Patient will demonstrate Patient specific functional scale avg > or = 8/10 to indicate reduced disability due to condition.   Goal status: New   4. Patient will demonstrate lumbar extension 100 % WFL s symptoms to facilitate upright standing, walking posture at PLOF s limitation.  Goal status: New   5.  Patient will demonstrate passive SLR bilaterally > 70 deg for normal mobility s restriction in daily activity.   Goal status: New   6.  Patient will demonstrate lumbar flexion to mid shin s symptoms in back for mobility in daily activity.  Goal status: New   PLAN:  PT FREQUENCY: 1-2x/week  PT DURATION: 10 weeks  PLANNED INTERVENTIONS: Can include 02853- PT Re-evaluation, 97110-Therapeutic exercises,  97530- Therapeutic activity, [id number]- Neuromuscular re-education, 97535- Self Care, 97140- Manual therapy, [phone]- Gait training, [phone]- Orthotic Fit/training, [phone]- Canalith repositioning, [id number]- Aquatic Therapy, [phone]- Electrical stimulation (unattended), [id number] Physical performance testing, 97016- Vasopneumatic device, [id number]- Ultrasound, [id number]- Traction (mechanical), [id number]- Ionotophoresis 4mg /ml Dexamethasone,  20560 - Needle insertion w/o injection 1 or 2 muscles, 20561 - Needle insertion w/o injection 3 or more muscles.    Patient/Family education, Balance training, Stair training, Taping, Dry Needling, Joint mobilization, Joint manipulation, Spinal manipulation, Spinal mobilization, Scar mobilization, Vestibular training, Visual/preceptual remediation/compensation, DME instructions, Cryotherapy, and Moist heat.  All performed as medically necessary.  All included unless contraindicated  PLAN FOR NEXT SESSION: Review HEP knowledge/results.  Lumbar/hip mobility gains.  Progression into functional lift/carry training as symptoms reduce.    Ozell Silvan, PT, DPT, OCS, ATC 05/08/2024  8:42 AM     "

## 2024-06-08 ENCOUNTER — Encounter: Admitting: Family Medicine

## 2024-06-15 ENCOUNTER — Encounter: Payer: Self-pay | Admitting: Family Medicine

## 2024-06-15 ENCOUNTER — Ambulatory Visit: Admitting: Family Medicine

## 2024-06-15 VITALS — BP 126/82 | HR 72 | Temp 97.9°F | Ht 76.0 in | Wt 318.0 lb

## 2024-06-15 DIAGNOSIS — J3089 Other allergic rhinitis: Secondary | ICD-10-CM | POA: Diagnosis not present

## 2024-06-15 DIAGNOSIS — Z6838 Body mass index (BMI) 38.0-38.9, adult: Secondary | ICD-10-CM

## 2024-06-15 DIAGNOSIS — M25511 Pain in right shoulder: Secondary | ICD-10-CM

## 2024-06-15 DIAGNOSIS — Z0001 Encounter for general adult medical examination with abnormal findings: Secondary | ICD-10-CM | POA: Diagnosis not present

## 2024-06-15 DIAGNOSIS — E669 Obesity, unspecified: Secondary | ICD-10-CM

## 2024-06-15 DIAGNOSIS — Z23 Encounter for immunization: Secondary | ICD-10-CM

## 2024-06-15 LAB — COMPREHENSIVE METABOLIC PANEL WITH GFR
ALT: 18 U/L (ref 3–53)
AST: 20 U/L (ref 5–37)
Albumin: 4.2 g/dL (ref 3.5–5.2)
Alkaline Phosphatase: 45 U/L (ref 39–117)
BUN: 19 mg/dL (ref 6–23)
CO2: 27 meq/L (ref 19–32)
Calcium: 9.2 mg/dL (ref 8.4–10.5)
Chloride: 106 meq/L (ref 96–112)
Creatinine, Ser: 0.93 mg/dL (ref 0.40–1.50)
GFR: 108.09 mL/min
Glucose, Bld: 81 mg/dL (ref 70–99)
Potassium: 3.8 meq/L (ref 3.5–5.1)
Sodium: 140 meq/L (ref 135–145)
Total Bilirubin: 0.6 mg/dL (ref 0.2–1.2)
Total Protein: 7.1 g/dL (ref 6.0–8.3)

## 2024-06-15 LAB — CBC WITH DIFFERENTIAL/PLATELET
Basophils Absolute: 0.1 10*3/uL (ref 0.0–0.1)
Basophils Relative: 0.7 % (ref 0.0–3.0)
Eosinophils Absolute: 0.2 10*3/uL (ref 0.0–0.7)
Eosinophils Relative: 2.8 % (ref 0.0–5.0)
HCT: 40.1 % (ref 39.0–52.0)
Hemoglobin: 13.6 g/dL (ref 13.0–17.0)
Lymphocytes Relative: 28.7 % (ref 12.0–46.0)
Lymphs Abs: 2.4 10*3/uL (ref 0.7–4.0)
MCHC: 33.9 g/dL (ref 30.0–36.0)
MCV: 91.4 fl (ref 78.0–100.0)
Monocytes Absolute: 0.6 10*3/uL (ref 0.1–1.0)
Monocytes Relative: 7.6 % (ref 3.0–12.0)
Neutro Abs: 5 10*3/uL (ref 1.4–7.7)
Neutrophils Relative %: 60.2 % (ref 43.0–77.0)
Platelets: 316 10*3/uL (ref 150.0–400.0)
RBC: 4.39 Mil/uL (ref 4.22–5.81)
RDW: 13 % (ref 11.5–15.5)
WBC: 8.3 10*3/uL (ref 4.0–10.5)

## 2024-06-15 LAB — HEMOGLOBIN A1C: Hgb A1c MFr Bld: 5.3 % (ref 4.6–6.5)

## 2024-06-15 LAB — LIPID PANEL
Cholesterol: 144 mg/dL (ref 28–200)
HDL: 39.2 mg/dL
LDL Cholesterol: 93 mg/dL (ref 10–99)
NonHDL: 104.33
Total CHOL/HDL Ratio: 4
Triglycerides: 57 mg/dL (ref 10.0–149.0)
VLDL: 11.4 mg/dL (ref 0.0–40.0)

## 2024-06-15 LAB — T4, FREE: Free T4: 0.58 ng/dL — ABNORMAL LOW (ref 0.60–1.60)

## 2024-06-15 LAB — TSH: TSH: 1.95 u[IU]/mL (ref 0.35–5.50)

## 2024-06-15 NOTE — Patient Instructions (Signed)
Please go downstairs for labs before you leave.

## 2024-06-19 ENCOUNTER — Ambulatory Visit: Payer: Self-pay | Admitting: Family Medicine
